# Patient Record
Sex: Male | Born: 1970 | Race: White | Hispanic: Yes | Marital: Married | Smoking: Former smoker
Health system: Southern US, Community
[De-identification: ages and names within clinical notes are randomized; demographics above are authoritative.]

## PROBLEM LIST (undated history)

## (undated) ENCOUNTER — Emergency Department (HOSPITAL_COMMUNITY): Admission: EM | Discharge status: Absent without leave | Payer: Self-pay

## (undated) DIAGNOSIS — E785 Hyperlipidemia, unspecified: Secondary | ICD-10-CM

## (undated) DIAGNOSIS — I219 Acute myocardial infarction, unspecified: Secondary | ICD-10-CM

## (undated) DIAGNOSIS — I10 Essential (primary) hypertension: Secondary | ICD-10-CM

## (undated) HISTORY — DX: Hyperlipidemia, unspecified: E78.5

## (undated) HISTORY — PX: CORONARY ANGIOPLASTY: SHX604

## (undated) HISTORY — DX: Essential (primary) hypertension: I10

---

## 2005-06-25 ENCOUNTER — Emergency Department (HOSPITAL_COMMUNITY): Admission: EM | Admit: 2005-06-25 | Discharge: 2005-06-25 | Payer: Self-pay | Admitting: Emergency Medicine

## 2005-10-22 ENCOUNTER — Emergency Department (HOSPITAL_COMMUNITY): Admission: EM | Admit: 2005-10-22 | Discharge: 2005-10-22 | Payer: Self-pay | Admitting: Emergency Medicine

## 2009-05-11 ENCOUNTER — Emergency Department (HOSPITAL_COMMUNITY): Admission: EM | Admit: 2009-05-11 | Discharge: 2009-05-12 | Payer: Self-pay | Admitting: Emergency Medicine

## 2015-03-22 HISTORY — PX: CARDIAC CATHETERIZATION: SHX172

## 2015-03-26 ENCOUNTER — Inpatient Hospital Stay (HOSPITAL_COMMUNITY)
Admission: EM | Admit: 2015-03-26 | Discharge: 2015-03-27 | DRG: 391 | Disposition: A | Discharge status: Home / Self Care | Payer: Commercial Managed Care - HMO | Attending: Cardiovascular Disease | Admitting: Cardiovascular Disease

## 2015-03-26 ENCOUNTER — Emergency Department (HOSPITAL_COMMUNITY): Payer: Commercial Managed Care - HMO

## 2015-03-26 ENCOUNTER — Encounter (HOSPITAL_COMMUNITY): Payer: Self-pay

## 2015-03-26 DIAGNOSIS — I249 Acute ischemic heart disease, unspecified: Secondary | ICD-10-CM | POA: Diagnosis present

## 2015-03-26 DIAGNOSIS — I214 Non-ST elevation (NSTEMI) myocardial infarction: Secondary | ICD-10-CM | POA: Diagnosis present

## 2015-03-26 DIAGNOSIS — I2510 Atherosclerotic heart disease of native coronary artery without angina pectoris: Secondary | ICD-10-CM | POA: Diagnosis present

## 2015-03-26 DIAGNOSIS — K2970 Gastritis, unspecified, without bleeding: Secondary | ICD-10-CM | POA: Diagnosis not present

## 2015-03-26 DIAGNOSIS — Z955 Presence of coronary angioplasty implant and graft: Secondary | ICD-10-CM

## 2015-03-26 DIAGNOSIS — R1013 Epigastric pain: Secondary | ICD-10-CM

## 2015-03-26 DIAGNOSIS — F419 Anxiety disorder, unspecified: Secondary | ICD-10-CM | POA: Diagnosis present

## 2015-03-26 DIAGNOSIS — Z79899 Other long term (current) drug therapy: Secondary | ICD-10-CM

## 2015-03-26 DIAGNOSIS — Z87891 Personal history of nicotine dependence: Secondary | ICD-10-CM

## 2015-03-26 DIAGNOSIS — R7989 Other specified abnormal findings of blood chemistry: Secondary | ICD-10-CM

## 2015-03-26 DIAGNOSIS — R778 Other specified abnormalities of plasma proteins: Secondary | ICD-10-CM

## 2015-03-26 DIAGNOSIS — Z7982 Long term (current) use of aspirin: Secondary | ICD-10-CM

## 2015-03-26 HISTORY — DX: Acute myocardial infarction, unspecified: I21.9

## 2015-03-26 LAB — BASIC METABOLIC PANEL
ANION GAP: 9 (ref 5–15)
BUN: 18 mg/dL (ref 6–20)
CALCIUM: 9.6 mg/dL (ref 8.9–10.3)
CO2: 24 mmol/L (ref 22–32)
CREATININE: 0.93 mg/dL (ref 0.61–1.24)
Chloride: 105 mmol/L (ref 101–111)
Glucose, Bld: 121 mg/dL — ABNORMAL HIGH (ref 65–99)
Potassium: 3.5 mmol/L (ref 3.5–5.1)
Sodium: 138 mmol/L (ref 135–145)

## 2015-03-26 LAB — CBC
HCT: 48.8 % (ref 39.0–52.0)
Hemoglobin: 17.1 g/dL — ABNORMAL HIGH (ref 13.0–17.0)
MCH: 32.8 pg (ref 26.0–34.0)
MCHC: 35 g/dL (ref 30.0–36.0)
MCV: 93.7 fL (ref 78.0–100.0)
PLATELETS: 257 10*3/uL (ref 150–400)
RBC: 5.21 MIL/uL (ref 4.22–5.81)
RDW: 12 % (ref 11.5–15.5)
WBC: 11.8 10*3/uL — ABNORMAL HIGH (ref 4.0–10.5)

## 2015-03-26 LAB — PROTIME-INR
INR: 1 (ref 0.00–1.49)
PROTHROMBIN TIME: 13.4 s (ref 11.6–15.2)

## 2015-03-26 LAB — I-STAT TROPONIN, ED: TROPONIN I, POC: 3.99 ng/mL — AB (ref 0.00–0.08)

## 2015-03-26 MED ORDER — NITROGLYCERIN 2 % TD OINT
1.0000 [in_us] | TOPICAL_OINTMENT | Freq: Four times a day (QID) | TRANSDERMAL | Status: DC
  Administered 2015-03-27: 1 [in_us] via TOPICAL
  Filled 2015-03-26: qty 1

## 2015-03-26 MED ORDER — MORPHINE SULFATE (PF) 4 MG/ML IV SOLN
4.0000 mg | Freq: Once | INTRAVENOUS | Status: AC
  Administered 2015-03-27: 4 mg via INTRAVENOUS
  Filled 2015-03-26: qty 1

## 2015-03-26 NOTE — ED Notes (Signed)
PA-C Bowie bedside with pt and family.

## 2015-03-26 NOTE — ED Provider Notes (Signed)
CSN: [id number]     Arrival date & time 03/26/15  2232 History   First MD Initiated Contact with Patient 03/26/15 2318     Chief Complaint  Patient presents with  . Chest Pain     (Consider location/radiation/quality/duration/timing/severity/associated sxs/prior Treatment) HPI   45 year old male with recent MI presents complaining of epigastric pain. Patient reported he was diagnosed with an MI 5 days ago when he was out of town at [facility] in [place] and he had a stent placed. He was discharged yesterday with cardiology follow-up and Effient. He has not had a chance to follow with cardiologist yet. Today he developed gradual onset of epigastric pain approximately 8 hours ago while he was laying down.  Pain is described as crampy, persistent, 5/10 nothing seems to make it better or worse.  He denies fever, chills, lightheadedness, dizziness, shortness of breath, diaphoresis, cough, hemoptysis, back pain, numbness or weakness. Patient states he feels very anxious when everything is in the hospital. He also mentioned that this pain felt similar to his heart attack pain except the location is in the abdomen instead of his chest. He did take his prescribed medication including Effient, aspirin, Atorvastatin, lisinopril, and metoprolol which were previously prescribed to him. He was a former smoker but quit last week. He reports remote history of cocaine use many years ago but none recently. He does drink alcohol, 3-4 beers daily. He does not know his family history well.  Past Medical History  Diagnosis Date  . MI (myocardial infarction) [facility](HCC)    Past Surgical History  Procedure Laterality Date  . Carotid stent     No family history on file. Social History  Substance Use Topics  . Smoking status: Former Games developermoker  . Smokeless tobacco: None  . Alcohol Use: Yes    Review of Systems  All other systems reviewed and are negative.     Allergies  Review of patient's allergies  indicates not on file.  Home Medications   Prior to Admission medications   Not on File   BP 111/68 mmHg  Pulse 82  Temp(Src) 98 F (36.7 C) (Oral)  Resp 16  Ht 5\' 9"  (1.753 m)  Wt 88.905 kg  BMI 28.93 kg/m2  SpO2 99% Physical Exam  Constitutional: He is oriented to person, place, and time. He appears well-developed and well-nourished. No distress.  HENT:  Head: Atraumatic.  Eyes: Conjunctivae are normal.  Neck: Neck supple. No JVD present.  Cardiovascular: Normal rate, regular rhythm and intact distal pulses.   Pulmonary/Chest: Effort normal and breath sounds normal.  Abdominal: Soft. Bowel sounds are normal. He exhibits no distension. There is no tenderness. There is no rebound and no guarding.  Musculoskeletal: He exhibits no edema.  Neurological: He is alert and oriented to person, place, and time.  Skin: No rash noted.  Psychiatric: He has a normal mood and affect.  Nursing note and vitals reviewed.   ED Course  Procedures (including critical care time) Labs Review Labs Reviewed  BASIC METABOLIC PANEL - Abnormal; Notable for the following:    Glucose, Bld 121 (*)    All other components within normal limits  CBC - Abnormal; Notable for the following:    WBC 11.8 (*)    Hemoglobin 17.1 (*)    All other components within normal limits  I-STAT TROPOININ, ED - Abnormal; Notable for the following:    Troponin i, poc 3.99 (*)    All other components within normal limits  PROTIME-INR  Imaging Review Dg Chest 2 View  03/26/2015  CLINICAL DATA:  Acute onset of generalized chest and epigastric abdominal pain. Initial encounter. EXAM: CHEST  2 VIEW COMPARISON:  None. FINDINGS: The lungs are well-aerated. Mild peribronchial thickening is noted. There is no evidence of focal opacification, pleural effusion or pneumothorax. The heart is normal in size; the mediastinal contour is within normal limits. No acute osseous abnormalities are seen. IMPRESSION: Mild peribronchial  thickening noted.  Lungs otherwise clear. Electronically Signed   By: Roanna Raider M.D.   On: 03/26/2015 23:03   I have personally reviewed and evaluated these images and lab results as part of my medical decision-making.   EKG Interpretation   Date/Time:  Saturday March 26 2015 22:32:11 EST Ventricular Rate:  83 PR Interval:  166 QRS Duration: 82 QT Interval:  362 QTC Calculation: 425 R Axis:   -14 Text Interpretation:  Normal sinus rhythm Inferior infarct , age  undetermined ST \\T \ T wave abnormality, consider lateral ischemia Abnormal  ECG Sinus rhythm T wave abnormality RSR' pattern in V1 Abnormal ekg  Confirmed by Gerhard Munch  MD [phone]) on 03/26/2015 10:41:19 PM      MDM   Final diagnoses:  Epigastric abdominal pain  Elevated troponin I level    BP 136/94 mmHg  Pulse 97  Temp(Src) 98 F (36.7 C) (Oral)  Resp 16  Ht 5\' 9"  (1.753 m)  Wt 88.905 kg  BMI 28.93 kg/m2  SpO2 100%   12:04 AM Patient with recent MI here with epigastric pain which he felt similar to his prior MI. Does have EKG changes concerning for inferior/lateral MI and elevated troponin of 3.99.  No reproducible abdominal tenderness on exam.  We have contact [facility] requesting for his hospitalization paperwork to be faxed to [place].  In the mean time,  Patient will be started on heparin, nitroglycerin, and morphine and supplemental O2. I have consult to cardiologist, Dr. Algie Coffer who agrees to see patient in the ER and was admitted for further management. Care discussed with Dr. Judd Lien.  12:08 AM Pt made aware of plan and agrees with plan.  Will monitor closely.    CRITICAL CARE Performed by: Fayrene Helper Total critical care time: 35 minutes Critical care time was exclusive of separately billable procedures and treating other patients. Critical care was necessary to treat or prevent imminent or life-threatening deterioration. Critical care was time spent personally by me on the following  activities: development of treatment plan with patient and/or surrogate as well as nursing, discussions with consultants, evaluation of patient's response to treatment, examination of patient, obtaining history from patient or surrogate, ordering and performing treatments and interventions, ordering and review of laboratory studies, ordering and review of radiographic studies, pulse oximetry and re-evaluation of patient's condition.   Fayrene Helper, PA-C 03/27/15 [id number]  Geoffery Lyons, MD 03/27/15 [phone]

## 2015-03-26 NOTE — ED Notes (Signed)
Pt here for epigastric pain and states last time he had pain to this area he had a heart attack. Pt states he had an MI Tuesday while he was out of town and had a stent placed. Pain onset tonight at 4pm while he was laying down. He has not followed up with cardiologist yet. Denies SOB/N/V but does report diarrhea.

## 2015-03-27 DIAGNOSIS — F419 Anxiety disorder, unspecified: Secondary | ICD-10-CM | POA: Diagnosis present

## 2015-03-27 DIAGNOSIS — Z87891 Personal history of nicotine dependence: Secondary | ICD-10-CM | POA: Diagnosis not present

## 2015-03-27 DIAGNOSIS — I214 Non-ST elevation (NSTEMI) myocardial infarction: Secondary | ICD-10-CM | POA: Diagnosis present

## 2015-03-27 DIAGNOSIS — R1013 Epigastric pain: Secondary | ICD-10-CM | POA: Diagnosis present

## 2015-03-27 DIAGNOSIS — I2510 Atherosclerotic heart disease of native coronary artery without angina pectoris: Secondary | ICD-10-CM | POA: Diagnosis present

## 2015-03-27 DIAGNOSIS — Z955 Presence of coronary angioplasty implant and graft: Secondary | ICD-10-CM | POA: Diagnosis not present

## 2015-03-27 DIAGNOSIS — K2970 Gastritis, unspecified, without bleeding: Secondary | ICD-10-CM | POA: Diagnosis present

## 2015-03-27 DIAGNOSIS — Z7982 Long term (current) use of aspirin: Secondary | ICD-10-CM | POA: Diagnosis not present

## 2015-03-27 DIAGNOSIS — Z79899 Other long term (current) drug therapy: Secondary | ICD-10-CM | POA: Diagnosis not present

## 2015-03-27 DIAGNOSIS — I249 Acute ischemic heart disease, unspecified: Secondary | ICD-10-CM | POA: Diagnosis present

## 2015-03-27 LAB — BASIC METABOLIC PANEL
Anion gap: 11 (ref 5–15)
BUN: 17 mg/dL (ref 6–20)
CHLORIDE: 105 mmol/L (ref 101–111)
CO2: 23 mmol/L (ref 22–32)
CREATININE: 0.93 mg/dL (ref 0.61–1.24)
Calcium: 9.3 mg/dL (ref 8.9–10.3)
GFR calc Af Amer: >60 mL/min (ref >60)
GFR calc non Af Amer: >60 mL/min (ref >60)
Glucose, Bld: 128 mg/dL — ABNORMAL HIGH (ref 65–99)
Potassium: 3.9 mmol/L (ref 3.5–5.1)
Sodium: 139 mmol/L (ref 135–145)

## 2015-03-27 LAB — CBC
HCT: 44.9 % (ref 39.0–52.0)
HEMOGLOBIN: 15.6 g/dL (ref 13.0–17.0)
MCH: 32.2 pg (ref 26.0–34.0)
MCHC: 34.7 g/dL (ref 30.0–36.0)
MCV: 92.6 fL (ref 78.0–100.0)
PLATELETS: 251 10*3/uL (ref 150–400)
RBC: 4.85 MIL/uL (ref 4.22–5.81)
RDW: 11.7 % (ref 11.5–15.5)
WBC: 12.2 10*3/uL — ABNORMAL HIGH (ref 4.0–10.5)

## 2015-03-27 LAB — LIPID PANEL
CHOL/HDL RATIO: 5.5 ratio
Cholesterol: 175 mg/dL (ref 0–200)
HDL: 32 mg/dL — ABNORMAL LOW (ref >40)
LDL CALC: 119 mg/dL — AB (ref 0–99)
TRIGLYCERIDES: 120 mg/dL (ref <150)
VLDL: 24 mg/dL (ref 0–40)

## 2015-03-27 LAB — HEPARIN LEVEL (UNFRACTIONATED): HEPARIN UNFRACTIONATED: 0.48 [IU]/mL (ref 0.30–0.70)

## 2015-03-27 LAB — PROTIME-INR
INR: 1.08 (ref 0.00–1.49)
PROTHROMBIN TIME: 14.2 s (ref 11.6–15.2)

## 2015-03-27 LAB — BRAIN NATRIURETIC PEPTIDE: B Natriuretic Peptide: 60.9 pg/mL (ref 0.0–100.0)

## 2015-03-27 LAB — TROPONIN I: TROPONIN I: 1.58 ng/mL — AB (ref <0.031)

## 2015-03-27 MED ORDER — ONDANSETRON HCL 4 MG/2ML IJ SOLN
4.0000 mg | Freq: Four times a day (QID) | INTRAMUSCULAR | Status: DC | PRN
  Administered 2015-03-27: 4 mg via INTRAVENOUS
  Filled 2015-03-27: qty 2

## 2015-03-27 MED ORDER — ASPIRIN EC 81 MG PO TBEC: 81.0000 mg | DELAYED_RELEASE_TABLET | Freq: Every day | ORAL | Status: DC

## 2015-03-27 MED ORDER — HEPARIN BOLUS VIA INFUSION
4000.0000 [IU] | Freq: Once | INTRAVENOUS | Status: AC
  Administered 2015-03-27: 4000 [IU] via INTRAVENOUS
  Filled 2015-03-27: qty 4000

## 2015-03-27 MED ORDER — ACETAMINOPHEN 325 MG PO TABS
650.0000 mg | ORAL_TABLET | ORAL | Status: DC | PRN
  Administered 2015-03-27: 650 mg via ORAL
  Filled 2015-03-27: qty 2

## 2015-03-27 MED ORDER — PRASUGREL HCL 10 MG PO TABS
10.0000 mg | ORAL_TABLET | Freq: Every day | ORAL | Status: DC
  Administered 2015-03-27: 10 mg via ORAL
  Filled 2015-03-27 (×2): qty 1

## 2015-03-27 MED ORDER — ATORVASTATIN CALCIUM 10 MG PO TABS: 80.0000 mg | ORAL_TABLET | Freq: Every day | ORAL | Status: DC

## 2015-03-27 MED ORDER — PANTOPRAZOLE SODIUM 40 MG PO TBEC: 40.0000 mg | DELAYED_RELEASE_TABLET | Freq: Every day | ORAL | Status: AC

## 2015-03-27 MED ORDER — PANTOPRAZOLE SODIUM 40 MG PO TBEC: 40.0000 mg | DELAYED_RELEASE_TABLET | Freq: Every day | ORAL | Status: DC

## 2015-03-27 MED ORDER — SODIUM CHLORIDE 0.9 % IJ SOLN: 3.0000 mL | INTRAMUSCULAR | Status: DC | PRN

## 2015-03-27 MED ORDER — NITROGLYCERIN 0.4 MG SL SUBL: 0.4000 mg | SUBLINGUAL_TABLET | SUBLINGUAL | Status: DC | PRN

## 2015-03-27 MED ORDER — METOPROLOL TARTRATE 25 MG PO TABS
25.0000 mg | ORAL_TABLET | Freq: Two times a day (BID) | ORAL | Status: DC
  Administered 2015-03-27 (×2): 25 mg via ORAL
  Filled 2015-03-27 (×2): qty 1

## 2015-03-27 MED ORDER — LISINOPRIL 2.5 MG PO TABS
2.5000 mg | ORAL_TABLET | Freq: Every day | ORAL | Status: DC
  Administered 2015-03-27: 2.5 mg via ORAL
  Filled 2015-03-27 (×2): qty 1

## 2015-03-27 MED ORDER — SODIUM CHLORIDE 0.9 % IJ SOLN: 3.0000 mL | Freq: Two times a day (BID) | INTRAMUSCULAR | Status: DC

## 2015-03-27 MED ORDER — SODIUM CHLORIDE 0.9 % IV SOLN: 250.0000 mL | INTRAVENOUS | Status: DC | PRN

## 2015-03-27 MED ORDER — STERILE WATER FOR INJECTION IJ SOLN
INTRAMUSCULAR | Status: AC
  Filled 2015-03-27: qty 10

## 2015-03-27 MED ORDER — PANTOPRAZOLE SODIUM 40 MG IV SOLR
40.0000 mg | INTRAVENOUS | Status: DC
  Administered 2015-03-27: 40 mg via INTRAVENOUS
  Filled 2015-03-27: qty 40

## 2015-03-27 MED ORDER — HEPARIN (PORCINE) IN NACL 100-0.45 UNIT/ML-% IJ SOLN
1250.0000 [IU]/h | INTRAMUSCULAR | Status: DC
  Administered 2015-03-27: 1000 [IU]/h via INTRAVENOUS
  Filled 2015-03-27 (×3): qty 250

## 2015-03-27 NOTE — ED Notes (Signed)
Paged MD about patient being Discharged. Patient able to ambulate without assistance.

## 2015-03-27 NOTE — Progress Notes (Signed)
ANTICOAGULATION CONSULT NOTE  Pharmacy Consult for Heparin Indication: chest pain/ACS  Not on File  Patient Measurements: Height: 5\' 9"  (175.3 cm) Weight: 196 lb (88.905 kg) IBW/kg (Calculated) : 70.7 Heparin Dosing Weight: 89 kg  Vital Signs: BP: 113/84 mmHg (01/15 1117) Pulse Rate: 77 (01/15 1117)  Labs:  Recent Labs  03/26/15 2259 03/27/15 0325 03/27/15 0825 03/27/15 0957  HGB 17.1* 15.6  --   --   HCT 48.8 44.9  --   --   PLT 257 251  --   --   LABPROT 13.4 14.2  --   --   INR 1.00 1.08  --   --   HEPARINUNFRC  --   --  0.48  --   CREATININE 0.93 0.93  --   --   TROPONINI  --   --   --  1.58*    Estimated Creatinine Clearance: 110.7 mL/min (by C-G formula based on Cr of 0.93).  Assessment: 45 y.o. M presents with CP. Recent MI (5 days ago) [place] Trop 3.99. CBC ok on admission. Heparin per pharmacy for r/o ACS. HL therapeutic at 0.48 after 4000 unit bolus and drip at 1250 units/hr.  CBC stable. No bleeding reported. Trop 1.58.  Cards plans to treat medically if troponins are trending down with TCTS consult in near future for CABG.   Goal of Therapy:  Heparin level 0.3-0.7 units/ml Monitor platelets by anticoagulation protocol: Yes   Plan:  Continue Heparin gtt at 1250 units/hr 2nd HL at 2115 with next cardiac enzyme Daily heparin level and CBC while on heparin  Herby AbrahamMichelle T. Cono Gebhard, Pharm.D. [phone] 03/27/2015 11:33 AM

## 2015-03-27 NOTE — ED Notes (Signed)
Dr Algie Cofferkadakia back in to see

## 2015-03-27 NOTE — ED Notes (Signed)
Dr kadakia at the bedside 

## 2015-03-27 NOTE — ED Notes (Signed)
Admitting at bedside states patient will be discharged.

## 2015-03-27 NOTE — ED Notes (Addendum)
Pt here with mid-abd pain sinced this am  No n v.  He was admitted  At [facility] in [place]  One week ago  He had a mi and had a stent placed.  His pain at that time was in his epigastric area not mid-abd as now.  nsr on the monitor.  Alert oriented skin warm and dry

## 2015-03-27 NOTE — ED Notes (Signed)
Patient getting dressed,

## 2015-03-27 NOTE — ED Notes (Signed)
Pt. Reports having a headache, denies any chest pain

## 2015-03-27 NOTE — Progress Notes (Signed)
ANTICOAGULATION CONSULT NOTE - Initial Consult  Pharmacy Consult for Heparin Indication: chest pain/ACS  Not on File  Patient Measurements: Height: 5\' 9"  (175.3 cm) Weight: 196 lb (88.905 kg) IBW/kg (Calculated) : 70.7 Heparin Dosing Weight: 89 kg  Vital Signs: Temp: 98 F (36.7 C) (01/14 2241) Temp Source: Oral (01/14 2241) BP: 136/94 mmHg (01/14 2335) Pulse Rate: 97 (01/14 2335)  Labs:  Recent Labs  03/26/15 2259  HGB 17.1*  HCT 48.8  PLT 257  LABPROT 13.4  INR 1.00  CREATININE 0.93    Estimated Creatinine Clearance: 110.7 mL/min (by C-G formula based on Cr of 0.93).   Medical History: Past Medical History  Diagnosis Date  . MI (myocardial infarction) (HCC)     Medications:  See electronic med rec  Assessment: 45 y.o. M presents with CP. Recent MI (5 days ago) [place] Trop 3.99. CBC ok on admission. To begin heparin for r/o ACS. Heparin 4000 units now and 1000 units/hr ordered by ED PA but not started yet.   Goal of Therapy:  Heparin level 0.3-0.7 units/ml Monitor platelets by anticoagulation protocol: Yes   Plan:  Heparin 4000 units IV bolus Heparin gtt at 1250 units/hr Will f/u heparin level in 6 hours Daily heparin level and CBC  Christoper Fabianaron Hendrick Pavich, PharmD, BCPS Clinical pharmacist, pager [phone] 03/27/2015,12:54 AM

## 2015-03-27 NOTE — H&P (Signed)
Referring Physician:  Ronaldo Crilly is an 45 y.o. male.                       Chief Complaint: Epigastric pain  HPI: 45 year old male with recent MI presents complaining of epigastric pain. Patient reported he was diagnosed with an MI 5 days ago when he was out of town at [employer] in [place] and he had a stent placed. He was discharged yesterday with cardiology follow-up and Effient. He has not had a chance to follow with cardiologist yet. Today he developed gradual onset of epigastric pain approximately 8 hours ago while he was laying down. Pain is described as crampy, persistent, 5/10 nothing seems to make it better or worse. He denies fever, chills, lightheadedness, dizziness, shortness of breath, diaphoresis, cough, hemoptysis, back pain, numbness or weakness. Patient states he feels very anxious when everything is in the hospital. He also mentioned that this pain felt similar to his heart attack pain except the location is in the abdomen instead of his chest. He did take his prescribed medication including Effient, aspirin, Atorvastatin, lisinopril, and metoprolol which were previously prescribed to him. He was a former smoker but quit last week. He reports remote history of cocaine use many years ago but none recently. He does drink alcohol, 3-4 beers daily. His cath and angioplasty records from [facility] center/[place] reveal bare metal mid LCX stenting and 3 vessel disease with CABG surgery recommendation post 6 weeks to 1 year.  Past Medical History  Diagnosis Date  . MI (myocardial infarction) [facility])       Past Surgical History  Procedure Laterality Date  . Carotid stent      No family history on file. Social History:  reports that he has quit smoking. He does not have any smokeless tobacco history on file. He reports that he drinks alcohol. His drug history is not on file.  Allergies: Not on File   (Not in a hospital admission)  Results for  orders placed or performed during the hospital encounter of 03/26/15 (from the past 48 hour(s))  Basic metabolic panel     Status: Abnormal   Collection Time: 03/26/15 10:59 PM  Result Value Ref Range   Sodium 138 135 - 145 mmol/L   Potassium 3.5 3.5 - 5.1 mmol/L   Chloride 105 101 - 111 mmol/L   CO2 24 22 - 32 mmol/L   Glucose, Bld 121 (H) 65 - 99 mg/dL   BUN 18 6 - 20 mg/dL   Creatinine, Ser 0.93 0.61 - 1.24 mg/dL   Calcium 9.6 8.9 - 10.3 mg/dL   GFR calc non Af Amer >60 >60 mL/min   GFR calc Af Amer >60 >60 mL/min    Comment: (NOTE) The eGFR has been calculated using the CKD EPI equation. This calculation has not been validated in all clinical situations. eGFR's persistently <60 mL/min signify possible Chronic Kidney Disease.    Anion gap 9 5 - 15  CBC     Status: Abnormal   Collection Time: 03/26/15 10:59 PM  Result Value Ref Range   WBC 11.8 (H) 4.0 - 10.5 K/uL   RBC 5.21 4.22 - 5.81 MIL/uL   Hemoglobin 17.1 (H) 13.0 - 17.0 g/dL   HCT 48.8 39.0 - 52.0 %   MCV 93.7 78.0 - 100.0 fL   MCH 32.8 26.0 - 34.0 pg   MCHC 35.0 30.0 - 36.0 g/dL   RDW 12.0 11.5 - 15.5 %  Platelets 257 150 - 400 K/uL  Protime-INR - (order if Patient is taking Coumadin / Warfarin)     Status: None   Collection Time: 03/26/15 10:59 PM  Result Value Ref Range   Prothrombin Time 13.4 11.6 - 15.2 seconds   INR 1.00 0.00 - 1.49  I-stat troponin, ED (not at [facility] [place] [facility])     Status: Abnormal   Collection Time: 03/26/15 11:00 PM  Result Value Ref Range   Troponin i, poc 3.99 (HH) 0.00 - 0.08 ng/mL   Comment NOTIFIED PHYSICIAN    Comment 3            Comment: Due to the release kinetics of cTnI, a negative result within the first hours of the onset of symptoms does not rule out myocardial infarction with certainty. If myocardial infarction is still suspected, repeat the test at appropriate intervals.   Brain natriuretic peptide     Status: None   Collection Time: 03/27/15 12:49 AM  Result Value Ref  Range   B Natriuretic Peptide 60.9 0.0 - 100.0 pg/mL   Dg Chest 2 View  03/26/2015  CLINICAL DATA:  Acute onset of generalized chest and epigastric abdominal pain. Initial encounter. EXAM: CHEST  2 VIEW COMPARISON:  None. FINDINGS: The lungs are well-aerated. Mild peribronchial thickening is noted. There is no evidence of focal opacification, pleural effusion or pneumothorax. The heart is normal in size; the mediastinal contour is within normal limits. No acute osseous abnormalities are seen. IMPRESSION: Mild peribronchial thickening noted.  Lungs otherwise clear. Electronically Signed   By: Garald Balding M.D.   On: 03/26/2015 23:03    Review Of Systems   Blood pressure 119/84, pulse 87, temperature 98 F (36.7 C), temperature source Oral, resp. rate 26, height '5\' 9"'  (1.753 m), weight 88.905 kg (196 lb), SpO2 99 %.  Physical Exam  Constitutional:  He appears well-developed and well-nourished. No distress.  HENT: Normocephalic and Atraumatic.  Eyes: Conjunctivae are normal. Sclera-non-icteric Neck: Neck supple. No JVD present.  Cardiovascular: Normal rate, regular rhythm and intact distal pulses. II/VI systolic murmur Pulmonary/Chest: Effort normal and breath sounds normal.  Abdominal: Soft. Bowel sounds are normal. He exhibits no distension. There is no tenderness. Musculoskeletal: No edema, cyanosis or clubbing.  Neurological: He is alert and oriented to person, place, and time. Moves all 4 extremities. Skin: No rash noted.  Psychiatric: He has a normal mood and affect.  Nursing note and vitals reviewed.  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  Treat medically if trending down. CVTS consult in near future.  Birdie Riddle, MD  03/27/2015, 2:11 AM

## 2015-03-27 NOTE — Discharge Summary (Signed)
Physician Discharge Summary  Patient ID: Danny Mendoza MRN: [id number] DOB/AGE: 1970/10/14 45 y.o.  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 His condition improves with IV pantoprazole. His Troponin-I showed downward trend. He has significant 3 vessel disease, his culprit lesion in left circumflex artery was treated with bare metal stent at [facility] 5 days ago and was advised to see local CVTS for CABG in near future. He will be followed by me in 5 days.  Consults: cardiology  Significant Diagnostic Studies: labs: Near normal CBC and CMET. Troponin-I of 3.99 followed by 1.58 ng/ml. Normal total cholesterol level with elevated LDL cholesterol of 118 mg/dL  EKG-NSR, inferior MI and lateral ischemia  Treatments: cardiac meds: lisinopril (Prinivil), metoprolol and aspirin and prasugrel. Stomach medicine: Pantoprazole.  Discharge Exam: Blood pressure 116/81, pulse 73, temperature 98 F (36.7 C), temperature source Oral, resp. rate 19, height 5\' 9"  (1.753 m), weight 88.905 kg (196 lb), SpO2 99 %. Constitutional: He appears well-developed and well-nourished. No distress.  HENT: Normocephalic and Atraumatic.  Eyes: Conjunctivae are normal. Sclera-non-icteric Neck: Neck supple. No JVD present.  Cardiovascular: Normal rate, regular rhythm and intact distal pulses. II/VI systolic murmur Pulmonary/Chest: Effort normal and breath sounds normal.  Abdominal: Soft. Bowel  sounds are normal. He exhibits no distension. There is no tenderness. Musculoskeletal: No edema, cyanosis or clubbing.  Neurological: He is alert and oriented to person, place, and time. Moves all 4 extremities. Skin: No rash noted.  Psychiatric: He has a normal mood and affect.   Disposition: 01-Self care or home.     Medication List    TAKE these medications        aspirin 81 MG chewable tablet  Chew 81 mg by mouth daily.     atorvastatin 80 MG tablet  Commonly known as:  LIPITOR  Take 80 mg by mouth daily at 6 PM.     lisinopril 2.5 MG tablet  Commonly known as:  PRINIVIL,ZESTRIL  Take 2.5 mg by mouth daily.     metoprolol tartrate 25 MG tablet  Commonly known as:  LOPRESSOR  Take 25 mg by mouth 2 (two) times daily.     nitroGLYCERIN 0.4 MG SL tablet  Commonly known as:  NITROSTAT  Place 0.4 mg under the tongue every 5 (five) minutes as needed for chest pain.     pantoprazole 40 MG tablet  Commonly known as:  PROTONIX  Take 1 tablet (40 mg total) by mouth at bedtime.     prasugrel 10 MG Tabs tablet  Commonly known as:  EFFIENT  Take 10 mg by mouth daily.           Follow-up Information    [facility]hal[facility] Morissette S, MD. Schedule an appointment as soon as possible for a visit in 5 days.   Specialty:  Cardiology   Contact information:   [address] [place] [address] [phone]       Signed: Ricki RodriguezKADAKIA,Samie Barclift S 03/27/2015, 2:15 PM

## 2015-03-27 NOTE — ED Notes (Signed)
The pts abd pain is better.  They med has helped

## 2015-03-27 NOTE — ED Notes (Signed)
C/o abd pain med given

## 2015-03-27 NOTE — ED Notes (Signed)
Asking for pain meds

## 2015-03-27 NOTE — ED Notes (Signed)
The pt reports that he is not having chest pain he has had abd pain all day.

## 2015-03-27 NOTE — ED Notes (Signed)
Spoke with Admitting. Ordered HH per MD and gave medications.

## 2015-03-27 NOTE — ED Notes (Signed)
Pt ambulated in the hallway with ease. No complaints

## 2015-04-19 ENCOUNTER — Other Ambulatory Visit: Payer: Self-pay | Admitting: *Deleted

## 2015-04-19 ENCOUNTER — Encounter: Payer: Self-pay | Admitting: Thoracic Surgery (Cardiothoracic Vascular Surgery)

## 2015-04-19 ENCOUNTER — Institutional Professional Consult (permissible substitution) (INDEPENDENT_AMBULATORY_CARE_PROVIDER_SITE_OTHER): Payer: Commercial Managed Care - HMO | Admitting: Thoracic Surgery (Cardiothoracic Vascular Surgery)

## 2015-04-19 VITALS — BP 139/95 | HR 74 | Resp 16 | Ht 69.0 in | Wt 190.0 lb

## 2015-04-19 DIAGNOSIS — I213 ST elevation (STEMI) myocardial infarction of unspecified site: Secondary | ICD-10-CM

## 2015-04-19 DIAGNOSIS — I2510 Atherosclerotic heart disease of native coronary artery without angina pectoris: Secondary | ICD-10-CM

## 2015-04-19 NOTE — Progress Notes (Signed)
PCP is No PCP Per Patient Referring Provider is Lorre Nick, MD Cardiology- Orpah Cobb, MD Leonette Most, MD and Keith Rake MD, [place], [place]  Chief Complaint  Patient presents with  . Coronary Artery Disease    3V/STEMI .Marland KitchenMarland KitchenCATHED 03/22/15 in [place], [place]., STENT to [facility], ECHO 03/22/15    HPI: [name] is a 45 year old man with a past medical history of hypertension who presented to the hospital in [place] in January with chest pain. He says this began the day before presentation with the sensation that he needed to belch, but could not. He continued to have that discomfort overnight and then the following morning it was worse. He eventually went to the emergency room and was diagnosed with an ST elevation MI. He was taken urgently to the catheterization laboratory by Dr. Leonette Most where he was found to have three-vessel disease. His circumflex was totally occluded and a bare-metal stent was placed with an excellent angiographic result. He was started on aspirin and prasugrel. He was seen in consultation by Dr. Keith Rake who recommended coronary bypass grafting at around a 6 week interval post MI. The patient lives in [place] and wish to have the remainder of his treatment done here.  After returning to [facility] he had an episode of epigastric pain on January 14. He was seen in the emergency room at [facility]. He ruled out for MI. He was observed overnight and then released the following day. He has not had any additional epigastric or chest pain since that time.  He is not close to his family but is not aware of any premature coronary disease. He has been feeling fatigued for several months prior to surgery. He has not had any orthopnea, paroxysmal nocturnal dyspnea, or peripheral edema. He does complain of some burning with urination, but denies hematuria or pyuria.   Past Medical History  Diagnosis Date  . MI (myocardial infarction) (HCC)   . Hypertension   . Dyslipidemia      Past Surgical History  Procedure Laterality Date  . Carotid stent    . Cardiac catheterization  03/22/2015    [facility]    No family history on file.  Social History Social History  Substance Use Topics  . Smoking status: Former Smoker -- 0.25 packs/day for 20 years    Types: Cigarettes    Quit date: 03/22/2015  . Smokeless tobacco: Never Used     Comment: QUIT AT TIME OF HIS MI  . Alcohol Use: No     Comment: QUIT AT TIME OF HIS MI...USUALLY ONE TO FOUR BEERS A DAY    Current Outpatient Prescriptions  Medication Sig Dispense Refill  . aspirin 81 MG chewable tablet Chew 81 mg by mouth daily.    Marland Kitchen atorvastatin (LIPITOR) 80 MG tablet Take 80 mg by mouth daily at 6 PM.    . lisinopril (PRINIVIL,ZESTRIL) 2.5 MG tablet Take 2.5 mg by mouth daily.    . metoprolol tartrate (LOPRESSOR) 25 MG tablet Take 25 mg by mouth 2 (two) times daily.    . nitroGLYCERIN (NITROSTAT) 0.4 MG SL tablet Place 0.4 mg under the tongue every 5 (five) minutes as needed for chest pain.    . pantoprazole (PROTONIX) 40 MG tablet Take 1 tablet (40 mg total) by mouth at bedtime. 30 tablet 3  . prasugrel (EFFIENT) 10 MG TABS tablet Take 10 mg by mouth daily.     No current facility-administered medications for this visit.    No Known Allergies  Review of Systems  Constitutional: Positive for activity change and fatigue. Negative for fever, chills and appetite change.  HENT: Positive for dental problem. Negative for trouble swallowing.   Eyes: Negative for visual disturbance.  Respiratory: Positive for shortness of breath. Negative for cough and wheezing.   Cardiovascular: Positive for chest pain. Negative for palpitations and leg swelling.  Gastrointestinal: Positive for abdominal pain. Negative for blood in stool and abdominal distention.  Endocrine: Negative for cold intolerance and heat intolerance.  Genitourinary: Positive for dysuria. Negative for hematuria.  Musculoskeletal:  Negative for myalgias and arthralgias.  Neurological: Negative for dizziness and weakness.  Hematological: Negative for adenopathy. Does not bruise/bleed easily.  Psychiatric/Behavioral:       Feels stressed and anxious    BP 139/95 mmHg  Pulse 74  Resp 16  Ht  (1.753 m)  Wt 190 lb (86.183 kg)  BMI 28.05 kg/m2  SpO2 98% Physical Exam  Constitutional: He is oriented to person, place, and time. He appears well-developed and well-nourished. No distress.  HENT:  Head: Normocephalic and atraumatic.  Mouth/Throat: No oropharyngeal exudate.  Eyes: Conjunctivae and EOM are normal. Pupils are equal, round, and reactive to light. No scleral icterus.  Neck: Normal range of motion. Neck supple. No thyromegaly present.  Cardiovascular: Normal rate, regular rhythm, normal heart sounds and intact distal pulses.  Exam reveals no gallop and no friction rub.   No murmur heard. Allen's test left- ~ 3 sec to refill  Pulmonary/Chest: Effort normal and breath sounds normal. No respiratory distress. He has no wheezes. He has no rales.  Abdominal: Soft. Bowel sounds are normal. He exhibits no distension. There is no tenderness.  Musculoskeletal: He exhibits no edema or tenderness.  Lymphadenopathy:    He has no cervical adenopathy.  Neurological: He is alert and oriented to person, place, and time. No cranial nerve deficit. He exhibits normal muscle tone. Coordination normal.  Motor 5/5 bilaterally  Skin: Skin is warm and dry.  Psychiatric: He has a normal mood and affect.  Vitals reviewed.    Diagnostic Tests: I personally reviewed the cardiac catheterization films and echocardiogram done in [place].   He has three-vessel disease. He had a totally occluded circumflex which was stented with a good result. He appears to have some narrowing in the proximal circumflex. There is a complex lesion in the LAD at the takeoff of the second diagonal and a large septal perforating branch is  approximately 85% stenosis. He has about 70% distal right coronary stenosis with an 85% stenosis in a PL branch as well.  Echocardiogram showed EF of approximately 50%. There was no significant valvular pathology.  Impression: 45 year old man who presented in early January in [place] with an acute coronary syndrome. He had a totally occluded left circumflex which was stented acutely with a bare metal stent. His catheterization showed three-vessel coronary disease and he has significant stenoses in both the right and LAD distributions that are not amenable to percutaneous intervention. He would benefit from coronary bypass grafting for survival benefit.  I discussed the general nature of the procedure, the need for general anesthesia, the use of cardio pulmonary bypass, and the incisions to be used with Mr. and Mrs Yeatts. I reviewed the expected hospital stay, overall recovery and short and long term outcomes. He understands this is a palliative operation not a curative one. I reviewed the indications, risks, benefits, and alternatives. They understand the risks include, but are not limited to death, stroke, MI, DVT/PE, bleeding,  possible need for transfusion, infections, cardiac arrhythmias, phrenic nerve dysfunction, and other organ system dysfunction including respiratory, renal, or GI complications.   He accepts the risk and agrees to proceed.  He is young, but his anatomy is not completely favorable for multiple arterial grafting. His second best target is the second diagonal branch of the LAD so it would be suitable to use a right coronary to that. His Allen's test was acceptable but a little slow on refill. Given that the anatomy is not typically favorable I think we would plan to do a vein graft to the right coronary system. He does have some narrowing in the proximal circumflex. I'll plan to Place a vein graft to that vessel time of CABG.   Plan: Coronary bypass grafting on  05/04/2015.  Stop prasugrel after dose on 04/27/2015.  I spent 60 minutes with Mr. and Mrs. Portis during this visit, greater than 50% spent in counseling  Loreli Slot, MD [facility] [phone]

## 2015-04-19 NOTE — Patient Instructions (Signed)
Stop Effient (prasugrel) after the dose on 04/27/15

## 2015-05-02 ENCOUNTER — Encounter (HOSPITAL_COMMUNITY)
Admission: RE | Admit: 2015-05-02 | Discharge: 2015-05-02 | Disposition: A | Discharge status: Home / Self Care | Payer: Commercial Managed Care - HMO | Source: Ambulatory Visit | Attending: Thoracic Surgery (Cardiothoracic Vascular Surgery) | Admitting: Thoracic Surgery (Cardiothoracic Vascular Surgery)

## 2015-05-02 ENCOUNTER — Ambulatory Visit (HOSPITAL_BASED_OUTPATIENT_CLINIC_OR_DEPARTMENT_OTHER)
Admission: RE | Admit: 2015-05-02 | Discharge: 2015-05-02 | Disposition: A | Discharge status: Home / Self Care | Payer: Commercial Managed Care - HMO | Source: Ambulatory Visit | Attending: Thoracic Surgery (Cardiothoracic Vascular Surgery) | Admitting: Thoracic Surgery (Cardiothoracic Vascular Surgery)

## 2015-05-02 ENCOUNTER — Encounter (HOSPITAL_COMMUNITY): Payer: Self-pay

## 2015-05-02 VITALS — BP 146/94 | HR 72 | Temp 98.1°F | Resp 20 | Ht 69.0 in | Wt 201.5 lb

## 2015-05-02 DIAGNOSIS — Z955 Presence of coronary angioplasty implant and graft: Secondary | ICD-10-CM | POA: Insufficient documentation

## 2015-05-02 DIAGNOSIS — Z87891 Personal history of nicotine dependence: Secondary | ICD-10-CM

## 2015-05-02 DIAGNOSIS — Z01818 Encounter for other preprocedural examination: Secondary | ICD-10-CM

## 2015-05-02 DIAGNOSIS — Z79899 Other long term (current) drug therapy: Secondary | ICD-10-CM | POA: Insufficient documentation

## 2015-05-02 DIAGNOSIS — E785 Hyperlipidemia, unspecified: Secondary | ICD-10-CM | POA: Insufficient documentation

## 2015-05-02 DIAGNOSIS — I2510 Atherosclerotic heart disease of native coronary artery without angina pectoris: Secondary | ICD-10-CM

## 2015-05-02 DIAGNOSIS — Z01812 Encounter for preprocedural laboratory examination: Secondary | ICD-10-CM

## 2015-05-02 DIAGNOSIS — I10 Essential (primary) hypertension: Secondary | ICD-10-CM | POA: Insufficient documentation

## 2015-05-02 DIAGNOSIS — I6522 Occlusion and stenosis of left carotid artery: Secondary | ICD-10-CM | POA: Insufficient documentation

## 2015-05-02 DIAGNOSIS — I252 Old myocardial infarction: Secondary | ICD-10-CM

## 2015-05-02 DIAGNOSIS — Z7982 Long term (current) use of aspirin: Secondary | ICD-10-CM

## 2015-05-02 DIAGNOSIS — Z0183 Encounter for blood typing: Secondary | ICD-10-CM | POA: Insufficient documentation

## 2015-05-02 LAB — URINALYSIS, ROUTINE W REFLEX MICROSCOPIC
Bilirubin Urine: NEGATIVE
Glucose, UA: NEGATIVE mg/dL
Hgb urine dipstick: NEGATIVE
KETONES UR: NEGATIVE mg/dL
LEUKOCYTES UA: NEGATIVE
NITRITE: NEGATIVE
PH: 5.5 (ref 5.0–8.0)
Protein, ur: NEGATIVE mg/dL
Specific Gravity, Urine: 1.026 (ref 1.005–1.030)

## 2015-05-02 LAB — CBC
HEMATOCRIT: 43.9 % (ref 39.0–52.0)
HEMOGLOBIN: 15 g/dL (ref 13.0–17.0)
MCH: 31.8 pg (ref 26.0–34.0)
MCHC: 34.2 g/dL (ref 30.0–36.0)
MCV: 93 fL (ref 78.0–100.0)
Platelets: 196 10*3/uL (ref 150–400)
RBC: 4.72 MIL/uL (ref 4.22–5.81)
RDW: 11.9 % (ref 11.5–15.5)
WBC: 8.2 10*3/uL (ref 4.0–10.5)

## 2015-05-02 LAB — PULMONARY FUNCTION TEST
DL/VA % PRED: 116 %
DL/VA: 5.41 ml/min/mmHg/L
DLCO COR: 32.81 ml/min/mmHg
DLCO UNC % PRED: 102 %
DLCO cor % pred: 101 %
DLCO unc: 33.18 ml/min/mmHg
FEF 25-75 PRE: 5.81 L/s
FEF 25-75 Post: 6.8 L/sec
FEF2575-%CHANGE-POST: 17 %
FEF2575-%PRED-PRE: 156 %
FEF2575-%Pred-Post: 183 %
FEV1-%Change-Post: 3 %
FEV1-%PRED-PRE: 94 %
FEV1-%Pred-Post: 97 %
FEV1-POST: 3.96 L
FEV1-PRE: 3.85 L
FEV1FVC-%CHANGE-POST: 5 %
FEV1FVC-%Pred-Pre: 109 %
FEV6-%CHANGE-POST: -1 %
FEV6-%PRED-POST: 86 %
FEV6-%PRED-PRE: 87 %
FEV6-POST: 4.35 L
FEV6-PRE: 4.42 L
FEV6FVC-%Change-Post: 0 %
FEV6FVC-%PRED-POST: 103 %
FEV6FVC-%PRED-PRE: 102 %
FVC-%Change-Post: -2 %
FVC-%PRED-POST: 84 %
FVC-%Pred-Pre: 85 %
FVC-Post: 4.35 L
FVC-Pre: 4.45 L
POST FEV6/FVC RATIO: 100 %
PRE FEV1/FVC RATIO: 86 %
Post FEV1/FVC ratio: 91 %
Pre FEV6/FVC Ratio: 100 %
RV % PRED: 62 %
RV: 1.21 L
TLC % PRED: 85 %
TLC: 5.94 L

## 2015-05-02 LAB — COMPREHENSIVE METABOLIC PANEL
ALBUMIN: 4.3 g/dL (ref 3.5–5.0)
ALK PHOS: 46 U/L (ref 38–126)
ALT: 45 U/L (ref 17–63)
AST: 26 U/L (ref 15–41)
Anion gap: 13 (ref 5–15)
BILIRUBIN TOTAL: 1.2 mg/dL (ref 0.3–1.2)
BUN: 14 mg/dL (ref 6–20)
CALCIUM: 9.6 mg/dL (ref 8.9–10.3)
CO2: 21 mmol/L — ABNORMAL LOW (ref 22–32)
Chloride: 106 mmol/L (ref 101–111)
Creatinine, Ser: 0.82 mg/dL (ref 0.61–1.24)
GFR calc Af Amer: >60 mL/min (ref >60)
GFR calc non Af Amer: >60 mL/min (ref >60)
GLUCOSE: 103 mg/dL — AB (ref 65–99)
Potassium: 4.1 mmol/L (ref 3.5–5.1)
Sodium: 140 mmol/L (ref 135–145)
TOTAL PROTEIN: 7.3 g/dL (ref 6.5–8.1)

## 2015-05-02 LAB — BLOOD GAS, ARTERIAL
Acid-Base Excess: 1.6 mmol/L (ref 0.0–2.0)
Bicarbonate: 25.8 mEq/L — ABNORMAL HIGH (ref 20.0–24.0)
DRAWN BY: 206361
FIO2: 0.21
O2 Saturation: 96.9 %
PATIENT TEMPERATURE: 98.6
PH ART: 7.405 (ref 7.350–7.450)
TCO2: 27.1 mmol/L (ref 0–100)
pCO2 arterial: 42.1 mmHg (ref 35.0–45.0)
pO2, Arterial: 93.5 mmHg (ref 80.0–100.0)

## 2015-05-02 LAB — SURGICAL PCR SCREEN
MRSA, PCR: NEGATIVE
Staphylococcus aureus: POSITIVE — AB

## 2015-05-02 LAB — PROTIME-INR
INR: 1.01 (ref 0.00–1.49)
Prothrombin Time: 13.5 seconds (ref 11.6–15.2)

## 2015-05-02 LAB — APTT: aPTT: 25 seconds (ref 24–37)

## 2015-05-02 LAB — TYPE AND SCREEN
ABO/RH(D): AB POS
ANTIBODY SCREEN: NEGATIVE

## 2015-05-02 LAB — ABO/RH: ABO/RH(D): AB POS

## 2015-05-02 MED ORDER — ALBUTEROL SULFATE (2.5 MG/3ML) 0.083% IN NEBU
2.5000 mg | INHALATION_SOLUTION | Freq: Once | RESPIRATORY_TRACT | Status: AC
  Administered 2015-05-02: 2.5 mg via RESPIRATORY_TRACT

## 2015-05-02 NOTE — Pre-Procedure Instructions (Addendum)
Danny Mendoza  05/02/2015      [facility] - [place] ([place]), [place] - [address] [place] ([place]) [place] [address] Phone: [phone] Fax: [phone]    Your procedure is scheduled on 05/04/15  Report to [facility] Admitting at 630 A.M.  Call this number if you have problems the morning of surgery:  [phone]   Remember:  Do not eat food or drink liquids after midnight.  Take these medicines the morning of surgery with A SIP OF WATER metoprolol, protonix, nitro if needed  STOP all herbel meds, nsaids (aleve,naproxen,advil,ibuprofen) Today including vitamins   effient per dr 04/27/15 last dose     Do not wear jewelry, make-up or nail polish.  Do not wear lotions, powders, or perfumes.  You may wear deodorant.  Do not shave 48 hours prior to surgery.  Men may shave face and neck.  Do not bring valuables to the hospital.  [facility] is not responsible for any belongings or valuables.  Contacts, dentures or bridgework may not be worn into surgery.  Leave your suitcase in the car.  After surgery it may be brought to your room.  For patients admitted to the hospital, discharge time will be determined by your treatment team.  Patients discharged the day of surgery will not be allowed to drive home.   Name and phone number of your driver:    Special instructions:   Special Instructions:  - Preparing for Surgery  Before surgery, you can play an important role.  Because skin is not sterile, your skin needs to be as free of germs as possible.  You can reduce the number of germs on you skin by washing with CHG (chlorahexidine gluconate) soap before surgery.  CHG is an antiseptic cleaner which kills germs and bonds with the skin to continue killing germs even after washing.  Please DO NOT use if you have an allergy to CHG or antibacterial soaps.  If your skin becomes reddened/irritated stop using the CHG and inform your nurse  when you arrive at Short Stay.  Do not shave (including legs and underarms) for at least 48 hours prior to the first CHG shower.  You may shave your face.  Please follow these instructions carefully:   1.  Shower with CHG Soap the night before surgery and the morning of Surgery.  2.  If you choose to wash your hair, wash your hair first as usual with your normal shampoo.  3.  After you shampoo, rinse your hair and body thoroughly to remove the Shampoo.  4.  Use CHG as you would any other liquid soap.  You can apply chg directly  to the skin and wash gently with scrungie or a clean washcloth.  5.  Apply the CHG Soap to your body ONLY FROM THE NECK DOWN.  Do not use on open wounds or open sores.  Avoid contact with your eyes ears, mouth and genitals (private parts).  Wash genitals (private parts)       with your normal soap.  6.  Wash thoroughly, paying special attention to the area where your surgery will be performed.  7.  Thoroughly rinse your body with warm water from the neck down.  8.  DO NOT shower/wash with your normal soap after using and rinsing off the CHG Soap.  9.  Pat yourself dry with a clean towel.  10.  Wear clean pajamas.            11.  Place clean sheets on your bed the night of your first shower and do not sleep with pets.  Day of Surgery  Do not apply any lotions/deodorants the morning of surgery.  Please wear clean clothes to the hospital/surgery center.  Please read over the following fact sheets that you were given. Pain Booklet, Coughing and Deep Breathing, Blood Transfusion Information, MRSA Information and Surgical Site Infection Prevention

## 2015-05-02 NOTE — Progress Notes (Addendum)
VASCULAR LAB PRELIMINARY  PRELIMINARY  PRELIMINARY  PRELIMINARY  Pre-op Cardiac Surgery  Carotid Findings:  Bilateral:  1-39% ICA stenosis.  Vertebral artery flow is antegrade.     Upper Extremity Right Left  Brachial Pressures 155 Triphasic 138 Triphasic  Radial Waveforms Triphasic Triohasic  Ulnar Waveforms Triphasic Triphasic  Palmar Arch (Allen's Test) Normal Normal   Findings:  Doppler waveforms remained normal bilaterally with both radial and ulnar compressions    Lower  Extremity Right Left  Dorsalis Pedis 167 Triphasic 159 Triphasic  Posterior Tibial 181 Triphasic 180 Triphasic  Ankle/Brachial Indices 1.17 1.16    Findings:  Doppler waveforms and ABIs are within normal limits bilaterally at rest.   Danny Mendoza, RVS 05/02/2015, 2:01 PM

## 2015-05-03 ENCOUNTER — Encounter (HOSPITAL_COMMUNITY): Payer: Self-pay

## 2015-05-03 LAB — HEMOGLOBIN A1C
HEMOGLOBIN A1C: 5.8 % — AB (ref 4.8–5.6)
MEAN PLASMA GLUCOSE: 120 mg/dL

## 2015-05-03 MED ORDER — AMINOCAPROIC ACID 250 MG/ML IV SOLN
INTRAVENOUS | Status: DC
  Filled 2015-05-03: qty 40

## 2015-05-03 MED ORDER — EPINEPHRINE HCL 1 MG/ML IJ SOLN
0.0000 ug/min | INTRAVENOUS | Status: DC
  Filled 2015-05-03: qty 4

## 2015-05-03 MED ORDER — DEXTROSE 5 % IV SOLN
1.5000 g | INTRAVENOUS | Status: AC
  Administered 2015-05-04: .75 g via INTRAVENOUS
  Administered 2015-05-04: 1.5 g via INTRAVENOUS
  Filled 2015-05-03: qty 1.5

## 2015-05-03 MED ORDER — CHLORHEXIDINE GLUCONATE 4 % EX LIQD: 30.0000 mL | CUTANEOUS | Status: DC

## 2015-05-03 MED ORDER — POTASSIUM CHLORIDE 2 MEQ/ML IV SOLN
80.0000 meq | INTRAVENOUS | Status: DC
  Filled 2015-05-03: qty 40

## 2015-05-03 MED ORDER — VANCOMYCIN HCL 10 G IV SOLR
1500.0000 mg | INTRAVENOUS | Status: AC
  Administered 2015-05-04: 1500 mg via INTRAVENOUS
  Filled 2015-05-03: qty 1500

## 2015-05-03 MED ORDER — DEXMEDETOMIDINE HCL IN NACL 400 MCG/100ML IV SOLN
0.1000 ug/kg/h | INTRAVENOUS | Status: DC
  Filled 2015-05-03: qty 100

## 2015-05-03 MED ORDER — CHLORHEXIDINE GLUCONATE 0.12 % MT SOLN
15.0000 mL | Freq: Once | OROMUCOSAL | Status: AC
  Administered 2015-05-04: 15 mL via OROMUCOSAL
  Filled 2015-05-03: qty 15

## 2015-05-03 MED ORDER — MAGNESIUM SULFATE 50 % IJ SOLN
40.0000 meq | INTRAMUSCULAR | Status: DC
  Filled 2015-05-03: qty 10

## 2015-05-03 MED ORDER — INSULIN REGULAR HUMAN 100 UNIT/ML IJ SOLN
INTRAMUSCULAR | Status: DC
  Filled 2015-05-03 (×2): qty 2.5

## 2015-05-03 MED ORDER — DOPAMINE-DEXTROSE 3.2-5 MG/ML-% IV SOLN
0.0000 ug/kg/min | INTRAVENOUS | Status: DC
  Filled 2015-05-03: qty 250

## 2015-05-03 MED ORDER — NITROGLYCERIN IN D5W 200-5 MCG/ML-% IV SOLN
2.0000 ug/min | INTRAVENOUS | Status: AC
  Administered 2015-05-04: 10 ug/min via INTRAVENOUS
  Filled 2015-05-03: qty 250

## 2015-05-03 MED ORDER — SODIUM CHLORIDE 0.9 % IV SOLN
INTRAVENOUS | Status: DC
  Filled 2015-05-03: qty 30

## 2015-05-03 MED ORDER — DEXTROSE 5 % IV SOLN
30.0000 ug/min | INTRAVENOUS | Status: DC
  Filled 2015-05-03 (×2): qty 2

## 2015-05-03 MED ORDER — PLASMA-LYTE 148 IV SOLN
INTRAVENOUS | Status: AC
  Administered 2015-05-04: 500 mL
  Filled 2015-05-03: qty 2.5

## 2015-05-03 MED ORDER — METOPROLOL TARTRATE 12.5 MG HALF TABLET: 12.5000 mg | ORAL_TABLET | Freq: Once | ORAL | Status: DC

## 2015-05-03 MED ORDER — DEXTROSE 5 % IV SOLN
750.0000 mg | INTRAVENOUS | Status: DC
  Filled 2015-05-03: qty 750

## 2015-05-03 NOTE — Progress Notes (Signed)
Anesthesia Chart Review: Patient is a 45 year old male scheduled for CABG on 05/04/15 by Dr. Dorris Fetch.  History includes inferior-posterior STEMI 03/22/15 s/p BMS CX [facility], Dr. Lorella Nimrod) with 3VCAD and future CABG recommended, recent former smoker (quit 03/22/15), HTN, dyslipidemia. No PCP. Cardiologist is Dr. Algie Coffer.   Meds include ASA, Lipitor, lisinopril, Nitro, Protonix, Effient (on hold since 04/26/15).  03/22/15 Cardiac cath [facility]; scanned under Media tab): Dominant RCA with eccentric complex 75% stenosis in the distal RCA to the PDA and PL branch. 85% PL branch. LAD originates from LM. Small diseased DIAG1 with complex 80%. The bifurcation of DIAG2. LAD is involved proximal and distal to bifurcation. 80% ostial DIAG with plaque extending to the mid LAD. LCX occluded. S/p PTCA/3X20 Rebel BMS proximal CX.  03/22/15 Echo ([place], see Care Everywhere): Summary There is normal left ventricular size with top normal LV wall thickness. Inferolateral, lateral hypokinesis to akinesis, LVEF 50%. Impaired relaxation, grade 1 diastolic dysfunction. Normal right ventricular size and function. The left atrium is normal in size. No valve abnormalities.  05/02/15 Carotid U/S (Preliminary): Bilateral 1-29% ICA stenosis. Antegrade vertebral flow.  05/02/15 PFTs: FVC 4.45 (85%), FEV1 3.85 (94%), DLCOunc 33.18 (102%).  Preoperative EKG, CXR, labs noted.   If no acute changes then I anticipate that he can proceed as planned.  Velna Ochs [facility] Short Stay Center/Anesthesiology Phone [phone] 05/03/2015 9:55 AM

## 2015-05-04 ENCOUNTER — Inpatient Hospital Stay (HOSPITAL_COMMUNITY): Payer: Commercial Managed Care - HMO

## 2015-05-04 ENCOUNTER — Encounter (HOSPITAL_COMMUNITY): Payer: Self-pay | Admitting: Certified Registered Nurse Anesthetist

## 2015-05-04 ENCOUNTER — Inpatient Hospital Stay (HOSPITAL_COMMUNITY)
Admission: RE | Admit: 2015-05-04 | Discharge: 2015-05-09 | DRG: 236 | Disposition: A | Discharge status: Home / Self Care | Payer: Commercial Managed Care - HMO | Source: Ambulatory Visit | Attending: Thoracic Surgery (Cardiothoracic Vascular Surgery) | Admitting: Thoracic Surgery (Cardiothoracic Vascular Surgery)

## 2015-05-04 ENCOUNTER — Inpatient Hospital Stay (HOSPITAL_COMMUNITY): Payer: Commercial Managed Care - HMO | Admitting: Anesthesiology

## 2015-05-04 ENCOUNTER — Encounter (HOSPITAL_COMMUNITY)
Admission: RE | Disposition: A | Discharge status: Home / Self Care | Payer: Self-pay | Source: Ambulatory Visit | Attending: Thoracic Surgery (Cardiothoracic Vascular Surgery)

## 2015-05-04 DIAGNOSIS — Z7982 Long term (current) use of aspirin: Secondary | ICD-10-CM

## 2015-05-04 DIAGNOSIS — Z955 Presence of coronary angioplasty implant and graft: Secondary | ICD-10-CM

## 2015-05-04 DIAGNOSIS — Z79899 Other long term (current) drug therapy: Secondary | ICD-10-CM

## 2015-05-04 DIAGNOSIS — R7303 Prediabetes: Secondary | ICD-10-CM | POA: Diagnosis present

## 2015-05-04 DIAGNOSIS — J9811 Atelectasis: Secondary | ICD-10-CM | POA: Diagnosis not present

## 2015-05-04 DIAGNOSIS — I2510 Atherosclerotic heart disease of native coronary artery without angina pectoris: Principal | ICD-10-CM | POA: Diagnosis present

## 2015-05-04 DIAGNOSIS — D62 Acute posthemorrhagic anemia: Secondary | ICD-10-CM | POA: Diagnosis not present

## 2015-05-04 DIAGNOSIS — I10 Essential (primary) hypertension: Secondary | ICD-10-CM | POA: Diagnosis present

## 2015-05-04 DIAGNOSIS — K219 Gastro-esophageal reflux disease without esophagitis: Secondary | ICD-10-CM | POA: Diagnosis present

## 2015-05-04 DIAGNOSIS — I319 Disease of pericardium, unspecified: Secondary | ICD-10-CM | POA: Diagnosis not present

## 2015-05-04 DIAGNOSIS — Z87891 Personal history of nicotine dependence: Secondary | ICD-10-CM | POA: Diagnosis not present

## 2015-05-04 DIAGNOSIS — I213 ST elevation (STEMI) myocardial infarction of unspecified site: Secondary | ICD-10-CM

## 2015-05-04 DIAGNOSIS — Z951 Presence of aortocoronary bypass graft: Secondary | ICD-10-CM

## 2015-05-04 DIAGNOSIS — I252 Old myocardial infarction: Secondary | ICD-10-CM | POA: Diagnosis not present

## 2015-05-04 DIAGNOSIS — E785 Hyperlipidemia, unspecified: Secondary | ICD-10-CM | POA: Diagnosis present

## 2015-05-04 DIAGNOSIS — E8770 Fluid overload, unspecified: Secondary | ICD-10-CM | POA: Diagnosis not present

## 2015-05-04 DIAGNOSIS — R195 Other fecal abnormalities: Secondary | ICD-10-CM | POA: Diagnosis not present

## 2015-05-04 HISTORY — PX: CORONARY ARTERY BYPASS GRAFT: SHX141

## 2015-05-04 HISTORY — PX: TEE WITHOUT CARDIOVERSION: SHX5443

## 2015-05-04 LAB — CBC
HCT: 32.3 % — ABNORMAL LOW (ref 39.0–52.0)
HCT: 35.2 % — ABNORMAL LOW (ref 39.0–52.0)
Hemoglobin: 11.1 g/dL — ABNORMAL LOW (ref 13.0–17.0)
Hemoglobin: 11.9 g/dL — ABNORMAL LOW (ref 13.0–17.0)
MCH: 30.8 pg (ref 26.0–34.0)
MCH: 31.1 pg (ref 26.0–34.0)
MCHC: 34.4 g/dL (ref 30.0–36.0)
MCHC: 33.8 g/dL (ref 30.0–36.0)
MCV: 89.7 fL (ref 78.0–100.0)
MCV: 91.9 fL (ref 78.0–100.0)
PLATELETS: 128 10*3/uL — AB (ref 150–400)
PLATELETS: 175 10*3/uL (ref 150–400)
RBC: 3.6 MIL/uL — AB (ref 4.22–5.81)
RBC: 3.83 MIL/uL — ABNORMAL LOW (ref 4.22–5.81)
RDW: 11.7 % (ref 11.5–15.5)
RDW: 12.2 % (ref 11.5–15.5)
WBC: 10.8 10*3/uL — ABNORMAL HIGH (ref 4.0–10.5)
WBC: 12.7 10*3/uL — ABNORMAL HIGH (ref 4.0–10.5)

## 2015-05-04 LAB — POCT I-STAT 3, ART BLOOD GAS (G3+)
ACID-BASE DEFICIT: 1 mmol/L (ref 0.0–2.0)
ACID-BASE EXCESS: 4 mmol/L — AB (ref 0.0–2.0)
Acid-base deficit: 3 mmol/L — ABNORMAL HIGH (ref 0.0–2.0)
Acid-base deficit: 1 mmol/L (ref 0.0–2.0)
BICARBONATE: 28.9 meq/L — AB (ref 20.0–24.0)
BICARBONATE: 23.4 meq/L (ref 20.0–24.0)
BICARBONATE: 25 meq/L — AB (ref 20.0–24.0)
Bicarbonate: 23.5 mEq/L (ref 20.0–24.0)
O2 SAT: 99 %
O2 Saturation: 100 %
O2 Saturation: 99 %
O2 Saturation: 97 %
PCO2 ART: 44 mmHg (ref 35.0–45.0)
PCO2 ART: 33.1 mmHg — AB (ref 35.0–45.0)
PCO2 ART: 49.1 mmHg — AB (ref 35.0–45.0)
PCO2 ART: 50.8 mmHg — AB (ref 35.0–45.0)
PH ART: 7.425 (ref 7.350–7.450)
PH ART: 7.454 — AB (ref 7.350–7.450)
PO2 ART: 340 mmHg — AB (ref 80.0–100.0)
PO2 ART: 131 mmHg — AB (ref 80.0–100.0)
PO2 ART: 135 mmHg — AB (ref 80.0–100.0)
PO2 ART: 102 mmHg — AB (ref 80.0–100.0)
Patient temperature: 35.4
Patient temperature: 38
TCO2: 30 mmol/L (ref 0–100)
TCO2: 25 mmol/L (ref 0–100)
TCO2: 25 mmol/L (ref 0–100)
TCO2: 26 mmol/L (ref 0–100)
pH, Arterial: 7.288 — ABNORMAL LOW (ref 7.350–7.450)
pH, Arterial: 7.305 — ABNORMAL LOW (ref 7.350–7.450)

## 2015-05-04 LAB — POCT I-STAT, CHEM 8
BUN: 14 mg/dL (ref 6–20)
BUN: 12 mg/dL (ref 6–20)
BUN: 13 mg/dL (ref 6–20)
BUN: 12 mg/dL (ref 6–20)
BUN: 12 mg/dL (ref 6–20)
BUN: 12 mg/dL (ref 6–20)
CALCIUM ION: 1.18 mmol/L (ref 1.12–1.23)
CALCIUM ION: 1.05 mmol/L — AB (ref 1.12–1.23)
CALCIUM ION: 1.06 mmol/L — AB (ref 1.12–1.23)
CALCIUM ION: 1.08 mmol/L — AB (ref 1.12–1.23)
CHLORIDE: 102 mmol/L (ref 101–111)
CHLORIDE: 103 mmol/L (ref 101–111)
CHLORIDE: 99 mmol/L — AB (ref 101–111)
CHLORIDE: 99 mmol/L — AB (ref 101–111)
CHLORIDE: 105 mmol/L (ref 101–111)
CREATININE: 0.6 mg/dL — AB (ref 0.61–1.24)
CREATININE: 0.8 mg/dL (ref 0.61–1.24)
Calcium, Ion: 1.21 mmol/L (ref 1.12–1.23)
Calcium, Ion: 1.01 mmol/L — ABNORMAL LOW (ref 1.12–1.23)
Chloride: 102 mmol/L (ref 101–111)
Creatinine, Ser: 0.7 mg/dL (ref 0.61–1.24)
Creatinine, Ser: 0.6 mg/dL — ABNORMAL LOW (ref 0.61–1.24)
Creatinine, Ser: 0.7 mg/dL (ref 0.61–1.24)
Creatinine, Ser: 0.6 mg/dL — ABNORMAL LOW (ref 0.61–1.24)
GLUCOSE: 118 mg/dL — AB (ref 65–99)
Glucose, Bld: 97 mg/dL (ref 65–99)
Glucose, Bld: 89 mg/dL (ref 65–99)
Glucose, Bld: 99 mg/dL (ref 65–99)
Glucose, Bld: 119 mg/dL — ABNORMAL HIGH (ref 65–99)
Glucose, Bld: 131 mg/dL — ABNORMAL HIGH (ref 65–99)
HCT: 31 % — ABNORMAL LOW (ref 39.0–52.0)
HCT: 31 % — ABNORMAL LOW (ref 39.0–52.0)
HCT: 34 % — ABNORMAL LOW (ref 39.0–52.0)
HEMATOCRIT: 42 % (ref 39.0–52.0)
HEMATOCRIT: 31 % — AB (ref 39.0–52.0)
HEMATOCRIT: 39 % (ref 39.0–52.0)
HEMOGLOBIN: 14.3 g/dL (ref 13.0–17.0)
HEMOGLOBIN: 10.5 g/dL — AB (ref 13.0–17.0)
HEMOGLOBIN: 10.5 g/dL — AB (ref 13.0–17.0)
HEMOGLOBIN: 10.5 g/dL — AB (ref 13.0–17.0)
Hemoglobin: 13.3 g/dL (ref 13.0–17.0)
Hemoglobin: 11.6 g/dL — ABNORMAL LOW (ref 13.0–17.0)
POTASSIUM: 3.8 mmol/L (ref 3.5–5.1)
POTASSIUM: 3.5 mmol/L (ref 3.5–5.1)
POTASSIUM: 3.8 mmol/L (ref 3.5–5.1)
Potassium: 4.7 mmol/L (ref 3.5–5.1)
Potassium: 4.1 mmol/L (ref 3.5–5.1)
Potassium: 4.5 mmol/L (ref 3.5–5.1)
SODIUM: 141 mmol/L (ref 135–145)
SODIUM: 140 mmol/L (ref 135–145)
SODIUM: 141 mmol/L (ref 135–145)
SODIUM: 135 mmol/L (ref 135–145)
SODIUM: 139 mmol/L (ref 135–145)
SODIUM: 142 mmol/L (ref 135–145)
TCO2: 30 mmol/L (ref 0–100)
TCO2: 28 mmol/L (ref 0–100)
TCO2: 28 mmol/L (ref 0–100)
TCO2: 24 mmol/L (ref 0–100)
TCO2: 26 mmol/L (ref 0–100)
TCO2: 25 mmol/L (ref 0–100)

## 2015-05-04 LAB — PLATELET COUNT: Platelets: 171 10*3/uL (ref 150–400)

## 2015-05-04 LAB — CREATININE, SERUM
Creatinine, Ser: 0.97 mg/dL (ref 0.61–1.24)
GFR calc Af Amer: >60 mL/min (ref >60)
GFR calc non Af Amer: >60 mL/min (ref >60)

## 2015-05-04 LAB — HEMOGLOBIN AND HEMATOCRIT, BLOOD
HEMATOCRIT: 30.7 % — AB (ref 39.0–52.0)
HEMOGLOBIN: 10.6 g/dL — AB (ref 13.0–17.0)

## 2015-05-04 LAB — POCT I-STAT 4, (NA,K, GLUC, HGB,HCT)
GLUCOSE: 95 mg/dL (ref 65–99)
HEMATOCRIT: 32 % — AB (ref 39.0–52.0)
Hemoglobin: 10.9 g/dL — ABNORMAL LOW (ref 13.0–17.0)
POTASSIUM: 3.6 mmol/L (ref 3.5–5.1)
SODIUM: 140 mmol/L (ref 135–145)

## 2015-05-04 LAB — PROTIME-INR
INR: 1.4 (ref 0.00–1.49)
PROTHROMBIN TIME: 17.3 s — AB (ref 11.6–15.2)

## 2015-05-04 LAB — APTT: aPTT: 30 seconds (ref 24–37)

## 2015-05-04 LAB — MAGNESIUM: MAGNESIUM: 2.9 mg/dL — AB (ref 1.7–2.4)

## 2015-05-04 SURGERY — CORONARY ARTERY BYPASS GRAFTING (CABG)
Anesthesia: General | Site: Chest

## 2015-05-04 MED ORDER — EPHEDRINE SULFATE 50 MG/ML IJ SOLN
INTRAMUSCULAR | Status: AC
  Filled 2015-05-04: qty 1

## 2015-05-04 MED ORDER — SODIUM CHLORIDE 0.45 % IV SOLN: INTRAVENOUS | Status: DC | PRN

## 2015-05-04 MED ORDER — ALBUMIN HUMAN 5 % IV SOLN
INTRAVENOUS | Status: DC | PRN
  Administered 2015-05-04 (×2): via INTRAVENOUS

## 2015-05-04 MED ORDER — ASPIRIN EC 325 MG PO TBEC
325.0000 mg | DELAYED_RELEASE_TABLET | Freq: Every day | ORAL | Status: DC
  Administered 2015-05-05 – 2015-05-07 (×3): 325 mg via ORAL
  Filled 2015-05-04 (×3): qty 1

## 2015-05-04 MED ORDER — ARTIFICIAL TEARS OP OINT
TOPICAL_OINTMENT | OPHTHALMIC | Status: DC | PRN
  Administered 2015-05-04: 1 via OPHTHALMIC

## 2015-05-04 MED ORDER — FAMOTIDINE IN NACL 20-0.9 MG/50ML-% IV SOLN
20.0000 mg | Freq: Two times a day (BID) | INTRAVENOUS | Status: DC
  Administered 2015-05-04: 20 mg via INTRAVENOUS

## 2015-05-04 MED ORDER — PROPOFOL 10 MG/ML IV BOLUS
INTRAVENOUS | Status: AC
  Filled 2015-05-04: qty 20

## 2015-05-04 MED ORDER — CHLORHEXIDINE GLUCONATE 0.12% ORAL RINSE (MEDLINE KIT)
15.0000 mL | Freq: Two times a day (BID) | OROMUCOSAL | Status: DC
  Administered 2015-05-05: 15 mL via OROMUCOSAL

## 2015-05-04 MED ORDER — MUPIROCIN 2 % EX OINT
1.0000 | TOPICAL_OINTMENT | Freq: Two times a day (BID) | CUTANEOUS | Status: DC
Start: 2015-05-04 — End: 2015-05-06
  Administered 2015-05-04 – 2015-05-06 (×4): 1 via NASAL
  Filled 2015-05-04: qty 22

## 2015-05-04 MED ORDER — ALBUMIN HUMAN 5 % IV SOLN
250.0000 mL | INTRAVENOUS | Status: AC | PRN
  Administered 2015-05-04 – 2015-05-05 (×4): 250 mL via INTRAVENOUS
  Filled 2015-05-04 (×4): qty 250

## 2015-05-04 MED ORDER — ROCURONIUM BROMIDE 100 MG/10ML IV SOLN
INTRAVENOUS | Status: DC | PRN
  Administered 2015-05-04 (×4): 50 mg via INTRAVENOUS

## 2015-05-04 MED ORDER — PROPOFOL 10 MG/ML IV BOLUS
INTRAVENOUS | Status: DC | PRN
  Administered 2015-05-04: 30 mg via INTRAVENOUS

## 2015-05-04 MED ORDER — BISACODYL 5 MG PO TBEC
10.0000 mg | DELAYED_RELEASE_TABLET | Freq: Every day | ORAL | Status: DC
  Administered 2015-05-05 – 2015-05-09 (×5): 10 mg via ORAL
  Filled 2015-05-04 (×5): qty 2

## 2015-05-04 MED ORDER — LACTATED RINGERS IV SOLN: 500.0000 mL | Freq: Once | INTRAVENOUS | Status: DC | PRN

## 2015-05-04 MED ORDER — POTASSIUM CHLORIDE 10 MEQ/50ML IV SOLN
10.0000 meq | Freq: Once | INTRAVENOUS | Status: AC
  Administered 2015-05-04: 10 meq via INTRAVENOUS

## 2015-05-04 MED ORDER — MIDAZOLAM HCL 10 MG/2ML IJ SOLN
INTRAMUSCULAR | Status: AC
  Filled 2015-05-04: qty 2

## 2015-05-04 MED ORDER — LIDOCAINE HCL (CARDIAC) 20 MG/ML IV SOLN
INTRAVENOUS | Status: DC | PRN
  Administered 2015-05-04: 40 mg via INTRAVENOUS

## 2015-05-04 MED ORDER — 0.9 % SODIUM CHLORIDE (POUR BTL) OPTIME
TOPICAL | Status: DC | PRN
  Administered 2015-05-04: 6000 mL

## 2015-05-04 MED ORDER — SODIUM CHLORIDE 0.9% FLUSH: 3.0000 mL | INTRAVENOUS | Status: DC | PRN

## 2015-05-04 MED ORDER — SUCCINYLCHOLINE CHLORIDE 20 MG/ML IJ SOLN
INTRAMUSCULAR | Status: AC
Start: 2015-05-04 — End: 2015-05-04
  Filled 2015-05-04: qty 1

## 2015-05-04 MED ORDER — DEXMEDETOMIDINE HCL IN NACL 200 MCG/50ML IV SOLN
INTRAVENOUS | Status: AC
  Filled 2015-05-04: qty 50

## 2015-05-04 MED ORDER — DOCUSATE SODIUM 100 MG PO CAPS
200.0000 mg | ORAL_CAPSULE | Freq: Every day | ORAL | Status: DC
  Administered 2015-05-05 – 2015-05-09 (×5): 200 mg via ORAL
  Filled 2015-05-04 (×5): qty 2

## 2015-05-04 MED ORDER — SODIUM CHLORIDE 0.9 % IV SOLN
200.0000 ug | INTRAVENOUS | Status: DC | PRN
  Administered 2015-05-04: 0.2 ug/kg/h via INTRAVENOUS

## 2015-05-04 MED ORDER — CHLORHEXIDINE GLUCONATE 0.12 % MT SOLN
15.0000 mL | OROMUCOSAL | Status: AC
  Administered 2015-05-04: 15 mL via OROMUCOSAL

## 2015-05-04 MED ORDER — ROCURONIUM BROMIDE 50 MG/5ML IV SOLN
INTRAVENOUS | Status: AC
Start: 2015-05-04 — End: 2015-05-04
  Filled 2015-05-04: qty 1

## 2015-05-04 MED ORDER — ROCURONIUM BROMIDE 50 MG/5ML IV SOLN
INTRAVENOUS | Status: AC
  Filled 2015-05-04: qty 1

## 2015-05-04 MED ORDER — LACTATED RINGERS IV SOLN: INTRAVENOUS | Status: DC

## 2015-05-04 MED ORDER — VANCOMYCIN HCL IN DEXTROSE 1-5 GM/200ML-% IV SOLN
1000.0000 mg | Freq: Once | INTRAVENOUS | Status: AC
  Administered 2015-05-04: 1000 mg via INTRAVENOUS
  Filled 2015-05-04: qty 200

## 2015-05-04 MED ORDER — HEMOSTATIC AGENTS (NO CHARGE) OPTIME
TOPICAL | Status: DC | PRN
  Administered 2015-05-04: 1 via TOPICAL

## 2015-05-04 MED ORDER — ANTISEPTIC ORAL RINSE SOLUTION (CORINZ)
7.0000 mL | Freq: Four times a day (QID) | OROMUCOSAL | Status: DC
  Administered 2015-05-04 – 2015-05-05 (×3): 7 mL via OROMUCOSAL

## 2015-05-04 MED ORDER — DEXTROSE 5 % IV SOLN
0.0000 ug/min | INTRAVENOUS | Status: DC
  Administered 2015-05-05: 35 ug/min via INTRAVENOUS
  Filled 2015-05-04 (×3): qty 2

## 2015-05-04 MED ORDER — LACTATED RINGERS IV SOLN
INTRAVENOUS | Status: DC | PRN
  Administered 2015-05-04 (×2): via INTRAVENOUS

## 2015-05-04 MED ORDER — ARTIFICIAL TEARS OP OINT
TOPICAL_OINTMENT | OPHTHALMIC | Status: AC
  Filled 2015-05-04: qty 3.5

## 2015-05-04 MED ORDER — BISACODYL 10 MG RE SUPP: 10.0000 mg | Freq: Every day | RECTAL | Status: DC

## 2015-05-04 MED ORDER — POTASSIUM CHLORIDE 10 MEQ/50ML IV SOLN
10.0000 meq | INTRAVENOUS | Status: AC
  Administered 2015-05-04 (×3): 10 meq via INTRAVENOUS

## 2015-05-04 MED ORDER — MAGNESIUM SULFATE 4 GM/100ML IV SOLN
4.0000 g | Freq: Once | INTRAVENOUS | Status: AC
  Administered 2015-05-04: 4 g via INTRAVENOUS
  Filled 2015-05-04: qty 100

## 2015-05-04 MED ORDER — TRAMADOL HCL 50 MG PO TABS
50.0000 mg | ORAL_TABLET | ORAL | Status: DC | PRN
  Administered 2015-05-05 (×2): 100 mg via ORAL
  Administered 2015-05-06 (×2): 50 mg via ORAL
  Administered 2015-05-06: 100 mg via ORAL
  Filled 2015-05-04: qty 2
  Filled 2015-05-04 (×2): qty 1
  Filled 2015-05-04: qty 2
  Filled 2015-05-04: qty 1
  Filled 2015-05-04: qty 2

## 2015-05-04 MED ORDER — SODIUM CHLORIDE 0.9 % IV SOLN
INTRAVENOUS | Status: DC
  Administered 2015-05-05: 20 mL via INTRAVENOUS

## 2015-05-04 MED ORDER — ACETAMINOPHEN 160 MG/5ML PO SOLN: 650.0000 mg | Freq: Once | ORAL | Status: AC

## 2015-05-04 MED ORDER — METOPROLOL TARTRATE 25 MG/10 ML ORAL SUSPENSION: 12.5000 mg | Freq: Two times a day (BID) | ORAL | Status: DC

## 2015-05-04 MED ORDER — MIDAZOLAM HCL 2 MG/2ML IJ SOLN: 2.0000 mg | INTRAMUSCULAR | Status: DC | PRN

## 2015-05-04 MED ORDER — FENTANYL CITRATE (PF) 250 MCG/5ML IJ SOLN
INTRAMUSCULAR | Status: AC
  Filled 2015-05-04: qty 25

## 2015-05-04 MED ORDER — SODIUM CHLORIDE 0.9% FLUSH
3.0000 mL | Freq: Two times a day (BID) | INTRAVENOUS | Status: DC
  Administered 2015-05-05: 3 mL via INTRAVENOUS

## 2015-05-04 MED ORDER — VECURONIUM BROMIDE 10 MG IV SOLR
INTRAVENOUS | Status: AC
  Filled 2015-05-04: qty 10

## 2015-05-04 MED ORDER — MIDAZOLAM HCL 5 MG/5ML IJ SOLN
INTRAMUSCULAR | Status: DC | PRN
  Administered 2015-05-04: 2 mg via INTRAVENOUS
  Administered 2015-05-04: 1 mg via INTRAVENOUS
  Administered 2015-05-04: 3 mg via INTRAVENOUS
  Administered 2015-05-04 (×2): 2 mg via INTRAVENOUS

## 2015-05-04 MED ORDER — MIDAZOLAM HCL 10 MG/2ML IJ SOLN
INTRAMUSCULAR | Status: AC
Start: 2015-05-04 — End: 2015-05-04
  Filled 2015-05-04: qty 2

## 2015-05-04 MED ORDER — CHLORHEXIDINE GLUCONATE CLOTH 2 % EX PADS
6.0000 | MEDICATED_PAD | Freq: Every day | CUTANEOUS | Status: DC
Start: 2015-05-04 — End: 2015-05-06
  Administered 2015-05-04 – 2015-05-06 (×3): 6 via TOPICAL

## 2015-05-04 MED ORDER — MORPHINE SULFATE (PF) 2 MG/ML IV SOLN
1.0000 mg | INTRAVENOUS | Status: DC | PRN
  Filled 2015-05-04: qty 2

## 2015-05-04 MED ORDER — ASPIRIN 81 MG PO CHEW: 324.0000 mg | CHEWABLE_TABLET | Freq: Every day | ORAL | Status: DC

## 2015-05-04 MED ORDER — FENTANYL CITRATE (PF) 250 MCG/5ML IJ SOLN
INTRAMUSCULAR | Status: AC
  Filled 2015-05-04: qty 5

## 2015-05-04 MED ORDER — VECURONIUM BROMIDE 10 MG IV SOLR
INTRAVENOUS | Status: DC | PRN
  Administered 2015-05-04: 5 mg via INTRAVENOUS

## 2015-05-04 MED ORDER — ONDANSETRON HCL 4 MG/2ML IJ SOLN
4.0000 mg | Freq: Four times a day (QID) | INTRAMUSCULAR | Status: DC | PRN
  Administered 2015-05-05 – 2015-05-09 (×4): 4 mg via INTRAVENOUS
  Filled 2015-05-04 (×4): qty 2

## 2015-05-04 MED ORDER — GELATIN ABSORBABLE MT POWD
OROMUCOSAL | Status: DC | PRN
  Administered 2015-05-04 (×3): 4 mL via TOPICAL

## 2015-05-04 MED ORDER — FENTANYL CITRATE (PF) 100 MCG/2ML IJ SOLN
INTRAMUSCULAR | Status: DC | PRN
  Administered 2015-05-04: 50 ug via INTRAVENOUS
  Administered 2015-05-04: 150 ug via INTRAVENOUS
  Administered 2015-05-04: 900 ug via INTRAVENOUS
  Administered 2015-05-04 (×2): 50 ug via INTRAVENOUS
  Administered 2015-05-04: 100 ug via INTRAVENOUS

## 2015-05-04 MED ORDER — SODIUM CHLORIDE 0.9 % IV SOLN
INTRAVENOUS | Status: DC
  Filled 2015-05-04: qty 2.5

## 2015-05-04 MED ORDER — HEPARIN SODIUM (PORCINE) 1000 UNIT/ML IJ SOLN
INTRAMUSCULAR | Status: AC
  Filled 2015-05-04: qty 3

## 2015-05-04 MED ORDER — ACETAMINOPHEN 500 MG PO TABS
1000.0000 mg | ORAL_TABLET | Freq: Four times a day (QID) | ORAL | Status: DC
  Administered 2015-05-04 – 2015-05-09 (×18): 1000 mg via ORAL
  Filled 2015-05-04 (×19): qty 2

## 2015-05-04 MED ORDER — ACETAMINOPHEN 650 MG RE SUPP
650.0000 mg | Freq: Once | RECTAL | Status: AC
  Administered 2015-05-04: 650 mg via RECTAL

## 2015-05-04 MED ORDER — ATORVASTATIN CALCIUM 80 MG PO TABS
80.0000 mg | ORAL_TABLET | Freq: Every day | ORAL | Status: DC
Start: 2015-05-04 — End: 2015-05-09
  Administered 2015-05-05 – 2015-05-09 (×5): 80 mg via ORAL
  Filled 2015-05-04 (×6): qty 1

## 2015-05-04 MED ORDER — STERILE WATER FOR INJECTION IJ SOLN
INTRAMUSCULAR | Status: AC
  Filled 2015-05-04: qty 30

## 2015-05-04 MED ORDER — SODIUM CHLORIDE 0.9 % IV SOLN: 250.0000 mL | INTRAVENOUS | Status: DC

## 2015-05-04 MED ORDER — SODIUM CHLORIDE 0.9 % IV SOLN
250.0000 [IU] | INTRAVENOUS | Status: DC | PRN
  Administered 2015-05-04: .7 [IU]/h via INTRAVENOUS

## 2015-05-04 MED ORDER — ACETAMINOPHEN 160 MG/5ML PO SOLN
1000.0000 mg | Freq: Four times a day (QID) | ORAL | Status: DC
  Administered 2015-05-05: 1000 mg
  Filled 2015-05-04: qty 40.6

## 2015-05-04 MED ORDER — PROTAMINE SULFATE 10 MG/ML IV SOLN
INTRAVENOUS | Status: DC | PRN
  Administered 2015-05-04: 330 mg via INTRAVENOUS
  Administered 2015-05-04: 20 mg via INTRAVENOUS
  Administered 2015-05-04: 10 mg via INTRAVENOUS

## 2015-05-04 MED ORDER — DEXTROSE 5 % IV SOLN
20.0000 mg | INTRAVENOUS | Status: DC | PRN
  Administered 2015-05-04: 15 ug/min via INTRAVENOUS

## 2015-05-04 MED ORDER — DEXTROSE 5 % IV SOLN
1.5000 g | Freq: Two times a day (BID) | INTRAVENOUS | Status: AC
  Administered 2015-05-05 – 2015-05-06 (×4): 1.5 g via INTRAVENOUS
  Filled 2015-05-04 (×4): qty 1.5

## 2015-05-04 MED ORDER — SODIUM CHLORIDE 0.9 % IV SOLN
INTRAVENOUS | Status: DC | PRN
  Administered 2015-05-04: 13:00:00 via INTRAVENOUS

## 2015-05-04 MED ORDER — DEXMEDETOMIDINE HCL IN NACL 200 MCG/50ML IV SOLN: 0.0000 ug/kg/h | INTRAVENOUS | Status: DC

## 2015-05-04 MED ORDER — SODIUM CHLORIDE 0.9 % IV SOLN
10.0000 g | INTRAVENOUS | Status: DC | PRN
  Administered 2015-05-04: 5 g/h via INTRAVENOUS

## 2015-05-04 MED ORDER — METOPROLOL TARTRATE 12.5 MG HALF TABLET
12.5000 mg | ORAL_TABLET | Freq: Two times a day (BID) | ORAL | Status: DC
  Filled 2015-05-04: qty 1

## 2015-05-04 MED ORDER — METOPROLOL TARTRATE 1 MG/ML IV SOLN
2.5000 mg | INTRAVENOUS | Status: DC | PRN
  Administered 2015-05-06: 5 mg via INTRAVENOUS
  Filled 2015-05-04: qty 5

## 2015-05-04 MED ORDER — ROCURONIUM BROMIDE 50 MG/5ML IV SOLN
INTRAVENOUS | Status: AC
  Filled 2015-05-04: qty 3

## 2015-05-04 MED ORDER — HEPARIN SODIUM (PORCINE) 1000 UNIT/ML IJ SOLN
INTRAMUSCULAR | Status: DC | PRN
  Administered 2015-05-04: 2 mL via INTRAVENOUS
  Administered 2015-05-04: 38 mL via INTRAVENOUS

## 2015-05-04 MED ORDER — LACTATED RINGERS IV SOLN
INTRAVENOUS | Status: DC | PRN
Start: 2015-05-04 — End: 2015-05-04
  Administered 2015-05-04: 09:00:00 via INTRAVENOUS

## 2015-05-04 MED ORDER — LACTATED RINGERS IV SOLN
INTRAVENOUS | Status: DC | PRN
  Administered 2015-05-04: 08:00:00 via INTRAVENOUS

## 2015-05-04 MED ORDER — PANTOPRAZOLE SODIUM 40 MG PO TBEC
40.0000 mg | DELAYED_RELEASE_TABLET | Freq: Every day | ORAL | Status: DC
  Administered 2015-05-05 – 2015-05-08 (×4): 40 mg via ORAL
  Filled 2015-05-04 (×4): qty 1

## 2015-05-04 MED ORDER — INSULIN REGULAR BOLUS VIA INFUSION
0.0000 [IU] | Freq: Three times a day (TID) | INTRAVENOUS | Status: DC
  Filled 2015-05-04: qty 10

## 2015-05-04 MED ORDER — OXYCODONE HCL 5 MG PO TABS
5.0000 mg | ORAL_TABLET | ORAL | Status: DC | PRN
  Administered 2015-05-05 (×2): 10 mg via ORAL
  Administered 2015-05-05: 5 mg via ORAL
  Administered 2015-05-05 (×2): 10 mg via ORAL
  Administered 2015-05-06: 5 mg via ORAL
  Administered 2015-05-06: 10 mg via ORAL
  Administered 2015-05-06: 5 mg via ORAL
  Administered 2015-05-06 – 2015-05-07 (×3): 10 mg via ORAL
  Administered 2015-05-07 – 2015-05-09 (×2): 5 mg via ORAL
  Filled 2015-05-04 (×2): qty 2
  Filled 2015-05-04 (×3): qty 1
  Filled 2015-05-04 (×6): qty 2
  Filled 2015-05-04: qty 1
  Filled 2015-05-04 (×2): qty 2

## 2015-05-04 MED ORDER — SODIUM BICARBONATE 8.4 % IV SOLN
25.0000 meq | Freq: Once | INTRAVENOUS | Status: AC
Start: 2015-05-04 — End: 2015-05-04
  Administered 2015-05-04: 25 meq via INTRAVENOUS

## 2015-05-04 MED ORDER — MORPHINE SULFATE (PF) 2 MG/ML IV SOLN
2.0000 mg | INTRAVENOUS | Status: DC | PRN
  Administered 2015-05-04: 4 mg via INTRAVENOUS
  Administered 2015-05-04 – 2015-05-05 (×4): 2 mg via INTRAVENOUS
  Administered 2015-05-05 (×5): 4 mg via INTRAVENOUS
  Administered 2015-05-05 (×2): 2 mg via INTRAVENOUS
  Administered 2015-05-06: 4 mg via INTRAVENOUS
  Filled 2015-05-04 (×2): qty 2
  Filled 2015-05-04: qty 1
  Filled 2015-05-04: qty 2
  Filled 2015-05-04: qty 1
  Filled 2015-05-04: qty 2
  Filled 2015-05-04: qty 1
  Filled 2015-05-04 (×2): qty 2
  Filled 2015-05-04: qty 1
  Filled 2015-05-04: qty 2

## 2015-05-04 MED ORDER — NITROGLYCERIN IN D5W 200-5 MCG/ML-% IV SOLN: 0.0000 ug/min | INTRAVENOUS | Status: DC

## 2015-05-04 MED ORDER — INSULIN ASPART 100 UNIT/ML ~~LOC~~ SOLN
0.0000 [IU] | SUBCUTANEOUS | Status: DC
  Administered 2015-05-04 – 2015-05-05 (×4): 2 [IU] via SUBCUTANEOUS

## 2015-05-04 MED FILL — Sodium Bicarbonate IV Soln 8.4%: INTRAVENOUS | Qty: 50 | Status: AC

## 2015-05-04 MED FILL — Heparin Sodium (Porcine) Inj 1000 Unit/ML: INTRAMUSCULAR | Qty: 30 | Status: AC

## 2015-05-04 MED FILL — Heparin Sodium (Porcine) Inj 1000 Unit/ML: INTRAMUSCULAR | Qty: 10 | Status: AC

## 2015-05-04 MED FILL — Mannitol IV Soln 20%: INTRAVENOUS | Qty: 500 | Status: AC

## 2015-05-04 MED FILL — Sodium Chloride IV Soln 0.9%: INTRAVENOUS | Qty: 2000 | Status: AC

## 2015-05-04 MED FILL — Electrolyte-R (PH 7.4) Solution: INTRAVENOUS | Qty: 4000 | Status: AC

## 2015-05-04 MED FILL — Magnesium Sulfate Inj 50%: INTRAMUSCULAR | Qty: 10 | Status: AC

## 2015-05-04 MED FILL — Potassium Chloride Inj 2 mEq/ML: INTRAVENOUS | Qty: 40 | Status: AC

## 2015-05-04 SURGICAL SUPPLY — 87 items
BAG DECANTER FOR FLEXI CONT (MISCELLANEOUS) ×4 IMPLANT
BANDAGE ACE 4X5 VEL STRL LF (GAUZE/BANDAGES/DRESSINGS) ×2 IMPLANT
BANDAGE ACE 6X5 VEL STRL LF (GAUZE/BANDAGES/DRESSINGS) ×2 IMPLANT
BANDAGE ELASTIC 4 VELCRO ST LF (GAUZE/BANDAGES/DRESSINGS) ×4 IMPLANT
BANDAGE ELASTIC 6 VELCRO ST LF (GAUZE/BANDAGES/DRESSINGS) ×4 IMPLANT
BASKET HEART  (ORDER IN 25'S) (MISCELLANEOUS) ×1
BASKET HEART (ORDER IN 25'S) (MISCELLANEOUS) ×1
BASKET HEART (ORDER IN 25S) (MISCELLANEOUS) ×2 IMPLANT
BLADE STERNUM SYSTEM 6 (BLADE) ×4 IMPLANT
BNDG GAUZE ELAST 4 BULKY (GAUZE/BANDAGES/DRESSINGS) ×4 IMPLANT
CANISTER SUCTION 2500CC (MISCELLANEOUS) ×4 IMPLANT
CANNULA EZ GLIDE AORTIC 21FR (CANNULA) ×4 IMPLANT
CATH CPB KIT HENDRICKSON (MISCELLANEOUS) ×4 IMPLANT
CATH ROBINSON RED A/P 18FR (CATHETERS) ×4 IMPLANT
CATH THORACIC 36FR (CATHETERS) ×4 IMPLANT
CATH THORACIC 36FR RT ANG (CATHETERS) ×4 IMPLANT
CLIP FOGARTY SPRING 6M (CLIP) ×2 IMPLANT
CLIP TI MEDIUM 24 (CLIP) IMPLANT
CLIP TI WIDE RED SMALL 24 (CLIP) ×6 IMPLANT
CRADLE DONUT ADULT HEAD (MISCELLANEOUS) ×4 IMPLANT
DRAPE CARDIOVASCULAR INCISE (DRAPES) ×4
DRAPE SLUSH/WARMER DISC (DRAPES) ×4 IMPLANT
DRAPE SRG 135X102X78XABS (DRAPES) ×2 IMPLANT
DRSG COVADERM 4X14 (GAUZE/BANDAGES/DRESSINGS) ×4 IMPLANT
ELECT REM PT RETURN 9FT ADLT (ELECTROSURGICAL) ×8
ELECTRODE REM PT RTRN 9FT ADLT (ELECTROSURGICAL) ×4 IMPLANT
GAUZE SPONGE 4X4 12PLY STRL (GAUZE/BANDAGES/DRESSINGS) ×8 IMPLANT
GLOVE BIO SURGEON STRL SZ 6.5 (GLOVE) ×5 IMPLANT
GLOVE BIO SURGEON STRL SZ7 (GLOVE) ×12 IMPLANT
GLOVE BIO SURGEONS STRL SZ 6.5 (GLOVE) ×5
GLOVE SURG SIGNA 7.5 PF LTX (GLOVE) ×12 IMPLANT
GOWN STRL REUS W/ TWL LRG LVL3 (GOWN DISPOSABLE) ×8 IMPLANT
GOWN STRL REUS W/ TWL XL LVL3 (GOWN DISPOSABLE) ×4 IMPLANT
GOWN STRL REUS W/TWL LRG LVL3 (GOWN DISPOSABLE) ×36
GOWN STRL REUS W/TWL XL LVL3 (GOWN DISPOSABLE) ×8
HEMOSTAT POWDER SURGIFOAM 1G (HEMOSTASIS) ×12 IMPLANT
HEMOSTAT SURGICEL 2X14 (HEMOSTASIS) ×4 IMPLANT
INSERT FOGARTY XLG (MISCELLANEOUS) IMPLANT
KIT BASIN OR (CUSTOM PROCEDURE TRAY) ×4 IMPLANT
KIT ROOM TURNOVER OR (KITS) ×4 IMPLANT
KIT SUCTION CATH 14FR (SUCTIONS) ×8 IMPLANT
KIT VASOVIEW W/TROCAR VH 2000 (KITS) ×4 IMPLANT
LIQUID BAND (GAUZE/BANDAGES/DRESSINGS) ×2 IMPLANT
MARKER GRAFT CORONARY BYPASS (MISCELLANEOUS) ×12 IMPLANT
NS IRRIG 1000ML POUR BTL (IV SOLUTION) ×22 IMPLANT
PACK OPEN HEART (CUSTOM PROCEDURE TRAY) ×4 IMPLANT
PAD ARMBOARD 7.5X6 YLW CONV (MISCELLANEOUS) ×8 IMPLANT
PAD ELECT DEFIB RADIOL ZOLL (MISCELLANEOUS) ×4 IMPLANT
PENCIL BUTTON HOLSTER BLD 10FT (ELECTRODE) ×4 IMPLANT
PUNCH AORTIC ROTATE 4.0MM (MISCELLANEOUS) ×2 IMPLANT
PUNCH AORTIC ROTATE 4.5MM 8IN (MISCELLANEOUS) IMPLANT
PUNCH AORTIC ROTATE 5MM 8IN (MISCELLANEOUS) IMPLANT
SET CARDIOPLEGIA MPS 5001102 (MISCELLANEOUS) ×2 IMPLANT
SPONGE GAUZE 4X4 12PLY STER LF (GAUZE/BANDAGES/DRESSINGS) ×4 IMPLANT
SPONGE LAP 4X18 X RAY DECT (DISPOSABLE) ×2 IMPLANT
SUT BONE WAX W31G (SUTURE) ×4 IMPLANT
SUT MNCRL AB 4-0 PS2 18 (SUTURE) IMPLANT
SUT PROLENE 3 0 SH DA (SUTURE) ×4 IMPLANT
SUT PROLENE 4 0 RB 1 (SUTURE)
SUT PROLENE 4 0 SH DA (SUTURE) IMPLANT
SUT PROLENE 4-0 RB1 .5 CRCL 36 (SUTURE) IMPLANT
SUT PROLENE 6 0 C 1 30 (SUTURE) ×14 IMPLANT
SUT PROLENE 7 0 BV1 MDA (SUTURE) ×8 IMPLANT
SUT PROLENE 8 0 BV175 6 (SUTURE) ×2 IMPLANT
SUT STEEL 6MS V (SUTURE) ×4 IMPLANT
SUT STEEL STERNAL CCS#1 18IN (SUTURE) IMPLANT
SUT STEEL SZ 6 DBL 3X14 BALL (SUTURE) ×4 IMPLANT
SUT VIC AB 1 CTX 36 (SUTURE) ×8
SUT VIC AB 1 CTX36XBRD ANBCTR (SUTURE) ×4 IMPLANT
SUT VIC AB 2-0 CT1 27 (SUTURE) ×4
SUT VIC AB 2-0 CT1 TAPERPNT 27 (SUTURE) IMPLANT
SUT VIC AB 2-0 CTX 27 (SUTURE) IMPLANT
SUT VIC AB 3-0 SH 27 (SUTURE)
SUT VIC AB 3-0 SH 27X BRD (SUTURE) IMPLANT
SUT VIC AB 3-0 X1 27 (SUTURE) IMPLANT
SUT VICRYL 4-0 PS2 18IN ABS (SUTURE) ×2 IMPLANT
SUTURE E-PAK OPEN HEART (SUTURE) ×4 IMPLANT
SYSTEM SAHARA CHEST DRAIN ATS (WOUND CARE) ×4 IMPLANT
TAPE CLOTH SURG 4X10 WHT LF (GAUZE/BANDAGES/DRESSINGS) ×2 IMPLANT
TAPE PAPER 2X10 WHT MICROPORE (GAUZE/BANDAGES/DRESSINGS) ×2 IMPLANT
TOWEL OR 17X24 6PK STRL BLUE (TOWEL DISPOSABLE) ×8 IMPLANT
TOWEL OR 17X26 10 PK STRL BLUE (TOWEL DISPOSABLE) ×8 IMPLANT
TRAY FOLEY IC TEMP SENS 16FR (CATHETERS) ×4 IMPLANT
TUBE FEEDING 8FR 16IN STR KANG (MISCELLANEOUS) ×4 IMPLANT
TUBING INSUFFLATION (TUBING) ×4 IMPLANT
UNDERPAD 30X30 INCONTINENT (UNDERPADS AND DIAPERS) ×4 IMPLANT
WATER STERILE IRR 1000ML POUR (IV SOLUTION) ×8 IMPLANT

## 2015-05-04 NOTE — Progress Notes (Addendum)
Check patients gas after 20 minutes on PS/CPAP, notified Dr. Servando Snare of gas, Dr. Servando Snare gave verbal order to give a half amp of bicarb and ok to extubate if NIF and VC parameters are met.  NIF and VC good. Patient extubated to nasal cannula 2L. IS is currently 300. Will continue to monitor.  Rowe Pavy, RN

## 2015-05-04 NOTE — Progress Notes (Signed)
Patient ID: Danny Mendoza, male   DOB: 1970/06/08, 45 y.o.   MRN: [id number] EVENING ROUNDS NOTE :     [address]       [place] [address]             [phone]                 Day of Surgery Procedure(s) (LRB): CORONARY ARTERY BYPASS GRAFTING (CABG) x  five,  using left internal mammary artery and right leg greater saphenous vein harvested endoscopically (N/A) TRANSESOPHAGEAL ECHOCARDIOGRAM (TEE) (N/A)  Total Length of Stay:  LOS: 0 days  BP 89/68 mmHg  Pulse 87  Temp(Src) 99.3 F (37.4 C) (Oral)  Resp 14  Wt 201 lb (91.173 kg)  SpO2 99%  .Intake/Output      02/21 0701 - 02/22 0700 02/22 0701 - 02/23 0700   I.V. (mL/kg)  3290.5 (36.1)   Blood  400   NG/GT  30   IV Piggyback  850   Total Intake(mL/kg)  4570.5 (50.1)   Urine (mL/kg/hr)  2725 (2.6)   Emesis/NG output  50 (0)   Chest Tube  300 (0.3)   Total Output   3075   Net   +1495.5          . sodium chloride 20 mL/hr at 05/04/15 1500  . [START ON 05/05/2015] sodium chloride    . sodium chloride 10 mL/hr at 05/04/15 1437  . dexmedetomidine Stopped (05/04/15 1630)  . insulin (NOVOLIN-R) infusion Stopped (05/04/15 1446)  . lactated ringers Stopped (05/04/15 1447)  . lactated ringers 20 mL/hr at 05/04/15 1500  . nitroGLYCERIN Stopped (05/04/15 1452)  . phenylephrine (NEO-SYNEPHRINE) Adult infusion 25 mcg/min (05/04/15 1800)     Lab Results  Component Value Date   WBC 10.8* 05/04/2015   HGB 10.9* 05/04/2015   HCT 32.0* 05/04/2015   PLT 128* 05/04/2015   GLUCOSE 95 05/04/2015   CHOL 175 03/27/2015   TRIG 120 03/27/2015   HDL 32* 03/27/2015   LDLCALC 119* 03/27/2015   ALT 45 05/02/2015   AST 26 05/02/2015   NA 140 05/04/2015   K 3.6 05/04/2015   CL 102 05/04/2015   CREATININE 0.60* 05/04/2015   BUN 12 05/04/2015   CO2 21* 05/02/2015   INR 1.40 05/04/2015   HGBA1C 5.8* 05/02/2015   On vent intubated   Not bleeding  Delight Ovens MD  Beeper [phone] Office  [phone] 05/04/2015 6:38 PM

## 2015-05-04 NOTE — Progress Notes (Signed)
Danny Mendoza noted to be coiled in PA/ RVOT on CXR  refloated swan. It took several aatempts but finally into PA with good tracing.  PA 30/16  Salvatore Decent. Dorris Fetch, MD [facility] [phone]

## 2015-05-04 NOTE — Anesthesia Procedure Notes (Addendum)
Central Venous Catheter Insertion Performed by: anesthesiologist 05/04/2015 8:10 AM Patient location: Pre-op. Preanesthetic checklist: patient identified, IV checked, site marked, risks and benefits discussed, surgical consent, monitors and equipment checked, pre-op evaluation, timeout performed and anesthesia consent Position: Trendelenburg Lidocaine 1% used for infiltration Landmarks identified and Seldinger technique used Catheter size: 8.5 Fr Central line was placed.Sheath introducer Swan type and PA catheter depth:thermodilation and 50PA Cath depth:50 Procedure performed using ultrasound guided technique. Attempts: 1 Following insertion, line sutured and dressing applied. Post procedure assessment: blood return through all ports, free fluid flow and no air. Patient tolerated the procedure well with no immediate complications.   Procedure Name: Intubation Date/Time: 05/04/2015 8:59 AM Performed by: Rogelia Boga Pre-anesthesia Checklist: Patient identified, Emergency Drugs available, Suction available and Patient being monitored Patient Re-evaluated:Patient Re-evaluated prior to inductionOxygen Delivery Method: Circle system utilized Preoxygenation: Pre-oxygenation with 100% oxygen Intubation Type: IV induction Ventilation: Oral airway inserted - appropriate to patient size and Two handed mask ventilation required Laryngoscope Size: Mac and 3 Grade View: Grade I Tube type: Oral Tube size: 8.0 mm Number of attempts: 1 Airway Equipment and Method: Stylet Placement Confirmation: ETT inserted through vocal cords under direct vision,  positive ETCO2 and breath sounds checked- equal and bilateral Secured at: 22 cm Tube secured with: Tape Dental Injury: Teeth and Oropharynx as per pre-operative assessment  Comments: 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

## 2015-05-04 NOTE — Progress Notes (Signed)
Rapid cardiac wean  

## 2015-05-04 NOTE — Anesthesia Preprocedure Evaluation (Addendum)
Anesthesia Evaluation  Patient identified by MRN, date of birth, ID band Patient awake    Reviewed: Allergy & Precautions, NPO status , Patient's Chart, lab work & pertinent test results  Airway Mallampati: II  TM Distance: >3 FB Neck ROM: Full    Dental  (+) Teeth Intact, Edentulous Upper   Pulmonary former smoker,    breath sounds clear to auscultation       Cardiovascular hypertension, Pt. on medications and Pt. on home beta blockers + CAD and + Past MI   Rhythm:Regular Rate:Normal  1/17 ECHO: normal LVF, EF 50%, valves OK   Neuro/Psych    GI/Hepatic GERD  Medicated and Controlled,  Endo/Other    Renal/GU      Musculoskeletal   Abdominal Normal abdominal exam  (+)   Peds  Hematology   Anesthesia Other Findings   Reproductive/Obstetrics                          Anesthesia Physical Anesthesia Plan  ASA: III  Anesthesia Plan: General   Post-op Pain Management:    Induction: Intravenous  Airway Management Planned: Oral ETT  Additional Equipment: Arterial line, CVP and PA Cath  Intra-op Plan:   Post-operative Plan: Post-operative intubation/ventilation  Informed Consent: I have reviewed the patients History and Physical, chart, labs and discussed the procedure including the risks, benefits and alternatives for the proposed anesthesia with the patient or authorized representative who has indicated his/her understanding and acceptance.   Dental advisory given  Plan Discussed with: CRNA, Anesthesiologist and Surgeon  Anesthesia Plan Comments:         Anesthesia Quick Evaluation

## 2015-05-04 NOTE — Procedures (Signed)
Extubation Procedure Note  Patient Details:   Name: Danny Mendoza DOB: [birth year] MRN: [id number]   Airway Documentation:     Evaluation  O2 sats: stable throughout Complications: No apparent complications Patient did tolerate procedure well. Bilateral Breath Sounds: Clear Suctioning: Airway, Oral Yes, Pt. extubated w/o incident to 2 lpm humidified n/c, (+) cuff leak prior and was able to lift/hold head off bed, NIF/FVC WNL, no s/p Stridor noted after extubation and able to speak, RT to mnoitor.  Joylene John 05/04/2015, 7:51 PM

## 2015-05-04 NOTE — Op Note (Signed)
NAMEMERLE, CIRELLI NO.:  [id number]  MEDICAL RECORD NO.:  [id number]  LOCATION:  2S01C                        FACILITY:  [facility]  PHYSICIAN:  Salvatore Decent. Dorris Fetch, M.D.DATE OF BIRTH:  Apr 13, 1970  DATE OF PROCEDURE:  05/04/2015 DATE OF DISCHARGE:                              OPERATIVE REPORT   PREOPERATIVE DIAGNOSIS:  Three-vessel coronary artery disease with recent ST-elevation myocardial infarction.  POSTOPERATIVE DIAGNOSIS:  Three-vessel coronary artery disease with recent ST-elevation myocardial infarction.  PROCEDURE:   Median sternotomy, extracorporeal circulation Coronary artery bypass grafting x 5   Left internal mammary artery to left anterior descending coronary artery,  Saphenous vein graft to 2nd diagonal,  Saphenous vein graft to obtuse marginal 1  Sequential saphenous vein graft to posterior descending and posterior   lateral Endoscopic vein harvest right leg.  SURGEON:  Salvatore Decent. Dorris Fetch, MD  ASSISTANT:  Doree Fudge, PA  ANESTHESIA:  General.  FINDINGS:  Good quality conduits.  LAD, diagonal, and OM good quality targets.  PD and PL fair quality targets.  Transesophageal echocardiography revealed preserved left ventricular function both pre and post bypass.  No significant valvular pathology.  CLINICAL NOTE:  Mr. Posten is a 45 year old man who had an ST-elevation MI in [place] in early January due to total occlusion of his left circumflex, this was stented acutely with a bare metal stent.  At catheterization, he had severe LAD and right coronary disease and also had some moderate plaque in the proximal circumflex.  He was advised to undergo coronary artery bypass grafting for survival benefit.  The indications, risks, benefits, and alternatives were discussed in detail with the patient.  He understood and accepted the risks and agreed to proceed.  OPERATIVE NOTE:  Mr. Soberano was brought to the preoperative holding  area on May 04, 2015.  Anesthesia placed a Swan-Ganz catheter and arterial blood pressure monitoring line.  He was taken to the operating room, anesthetized, and intubated.  A Foley catheter was placed. Intravenous antibiotics were administered.  The chest, abdomen, and legs were prepped and draped in usual sterile fashion.  Transesophageal echocardiography was performed.  It revealed preserved left ventricular function with no significant valvular pathology.  A median sternotomy was performed and the left internal mammary artery was harvested using standard technique.  Simultaneously, an incision was made in the medial aspect of the right leg at the level of the knee. The greater saphenous vein was harvested from mid calf to groin.  Both the mammary artery and saphenous vein were good quality conduits. 2000 units of heparin was administered during the vessel harvest.  The remainder of full heparin dose was given prior to opening the pericardium.  After harvesting the conduits, the pericardium was opened.  The ascending aorta was inspected.  It was of normal size with no atherosclerotic disease evident.  After confirming adequate anticoagulation with ACT measurement, the aorta was cannulated via concentric 2-0 Ethibond pledgeted pursestring sutures.  A dual-stage venous cannula was placed via a pursestring suture in the right atrial appendage.  Cardiopulmonary bypass was initiated.  Flows were maintained per protocol.  The patient was cooled to 32 degrees Celsius.  The coronary arteries were inspected and anastomotic  sites were chosen.  The conduits were inspected and cut to length.  A foam pad was placed in the pericardium to insulate the heart and protect the left phrenic nerve.  A temperature probe was placed in myocardial septum and a cardioplegia cannula was placed in the ascending aorta.  The aorta was crossclamped.  The left ventricle was emptied via the aortic root vent.   Cardiac arrest then was achieved with combination of cold antegrade blood cardioplegia and topical ice saline. 1.5 L of cardioplegia was administered.  There was a rapid diastolic arrest and septal cooling to 11 degrees Celsius.  A reversed saphenous vein graft was placed sequentially to the posterior descending and posterolateral branches of the right coronary.  These were both small, fair quality target vessels.  The vein was good quality.  A side-to-side anastomosis was performed to the posterior descending and an end-to-side to the posterolateral. Both were done with running 7-0 Prolene sutures.  All anastomoses were probed proximally and distally at their completion to ensure patency.  Cardioplegia was administered down each vein graft at its completion to assess flow and hemostasis, both were satisfactory.  Additional cardioplegia was administered down the aortic root as well.  A reversed saphenous vein graft then was placed end-to-side of the first diagonal branch to the LAD.  This was a 1.5 mm good quality vessel at the site of the anastomosis.  The vein was of good quality.  It was anastomosed end-to-side with a running 7-0 Prolene suture.  There was good flow and good hemostasis with cardioplegia administration.  Next, a reversed saphenous vein graft was placed end-to-side to obtuse marginal 1, this was distal to the previously-placed stent.  It was a 2 mm good quality target vessel.  The vein was of good quality and an end- to-side anastomosis was performed with a running 7-0 Prolene suture.  A probe passed easily proximally and distally.  Cardioplegia was administered.  There was good flow and good hemostasis.  After giving additional cardioplegia down the aortic root as well as the vein grafts, the left internal mammary artery was brought through a window in the pericardium and the distal end was beveled.  It was then anastomosed end-to-side to the distal LAD.  The LAD  was a 2 mm good quality target.  The mammary was a 1.5 mm good quality conduit.  The end- to-side anastomosis was performed with a running 8-0 Prolene suture.  At completion of anastomosis, the bulldog clamp was briefly removed to inspect for hemostasis.  There was rapid septal rewarming.  The bulldog clamp was replaced.  The mammary pedicle was tacked to the epicardial surface of the heart with 6-0 Prolene sutures.  Additional cardioplegia was administered.  The vein grafts were cut to length.  The cardioplegia cannula was removed from the ascending aorta. The proximal vein graft anastomoses were performed to 4.5 mm punch aortotomies with running 6-0 Prolene sutures.  At the completion of final proximal anastomosis, the patient was placed in Trendelenburg position.  Lidocaine was administered.  The aortic root was de-aired. The bulldog clamp was again removed from the left mammary artery.  The aortic crossclamp was removed.  The total crossclamp time was 86 minutes.  The patient spontaneously resumed sinus rhythm and did not require defibrillation.  While rewarming was completed, all proximal and distal anastomoses were inspected for hemostasis.  Epicardial pacing wires were placed on the right ventricle and right atrium.  When the patient had rewarmed to a core  temperature of 37 degrees Celsius, he was weaned from cardiopulmonary bypass on the first attempt.  He was in sinus rhythm and did not require inotropic support.  Total bypass time was 119 minutes. The initial cardiac index was greater than 2 L/min/m2.  He remained hemodynamically stable throughout the post bypass period.  A test dose of protamine was administered was well tolerated.  The atrial and aortic cannulae were removed.  Remainder of protamine was administered without incident.  Chest irrigated with warm saline.  Hemostasis was achieved. The pericardium was reapproximated with interrupted 3-0 silk sutures, it came  together easily without tension.  Left pleural and mediastinal chest tubes were placed through separate subcostal incisions.  Sternum was closed with a combination of single and double heavy gauge stainless steel wires.  The pectoralis fascia, subcutaneous tissue, and skin were closed in standard fashion.  All sponge, needle, and instrument counts were correct at the end of the procedure.  The patient was taken from the operating room to the Surgical Intensive Care Unit intubated and in good condition.     Salvatore Decent Dorris Fetch, M.D.     SCH/MEDQ  D:  05/04/2015  T:  05/04/2015  Job:  [id number]

## 2015-05-04 NOTE — OR Nursing (Signed)
13:05- 40 minute call to SICU charge nurse

## 2015-05-04 NOTE — Progress Notes (Signed)
Echocardiogram Echocardiogram Transesophageal has been performed.  Dorothey Baseman 05/04/2015, 9:41 AM

## 2015-05-04 NOTE — Interval H&P Note (Signed)
History and Physical Interval Note:  05/04/2015 8:18 AM  Danny Mendoza  has presented today for surgery, with the diagnosis of CAD  The various methods of treatment have been discussed with the patient and family. After consideration of risks, benefits and other options for treatment, the patient has consented to  Procedure(s): CORONARY ARTERY BYPASS GRAFTING (CABG) (N/A) TRANSESOPHAGEAL ECHOCARDIOGRAM (TEE) (N/A) as a surgical intervention .  The patient's history has been reviewed, patient examined, no change in status, stable for surgery.  I have reviewed the patient's chart and labs.  Questions were answered to the patient's satisfaction.     Loreli Slot

## 2015-05-04 NOTE — H&P (View-Only) (Signed)
PCP is No PCP Per Patient Referring Provider is Lorre Nick, MD Cardiology- Orpah Cobb, MD Leonette Most, MD and Keith Rake MD, [place], [place]  Chief Complaint  Patient presents with  . Coronary Artery Disease    3V/STEMI .Marland KitchenMarland KitchenCATHED 03/22/15 in [place], [place]., STENT to [place], ECHO 03/22/15    HPI: [name] is a 45 year old man with a past medical history of hypertension who presented to the hospital in [place] in January with chest pain. He says this began the day before presentation with the sensation that he needed to belch, but could not. He continued to have that discomfort overnight and then the following morning it was worse. He eventually went to the emergency room and was diagnosed with an ST elevation MI. He was taken urgently to the catheterization laboratory by Dr. Leonette Most where he was found to have three-vessel disease. His circumflex was totally occluded and a bare-metal stent was placed with an excellent angiographic result. He was started on aspirin and prasugrel. He was seen in consultation by Dr. Keith Rake who recommended coronary bypass grafting at around a 6 week interval post MI. The patient lives in [place] and wish to have the remainder of his treatment done here.  After returning to [facility] he had an episode of epigastric pain on January 14. He was seen in the emergency room at [facility]. He ruled out for MI. He was observed overnight and then released the following day. He has not had any additional epigastric or chest pain since that time.  He is not close to his family but is not aware of any premature coronary disease. He has been feeling fatigued for several months prior to surgery. He has not had any orthopnea, paroxysmal nocturnal dyspnea, or peripheral edema. He does complain of some burning with urination, but denies hematuria or pyuria.   Past Medical History  Diagnosis Date  . MI (myocardial infarction) (HCC)   . Hypertension   . Dyslipidemia      Past Surgical History  Procedure Laterality Date  . Carotid stent    . Cardiac catheterization  03/22/2015    [facility]    No family history on file.  Social History Social History  Substance Use Topics  . Smoking status: Former Smoker -- 0.25 packs/day for 20 years    Types: Cigarettes    Quit date: 03/22/2015  . Smokeless tobacco: Never Used     Comment: QUIT AT TIME OF HIS MI  . Alcohol Use: No     Comment: QUIT AT TIME OF HIS MI...USUALLY ONE TO FOUR BEERS A DAY    Current Outpatient Prescriptions  Medication Sig Dispense Refill  . aspirin 81 MG chewable tablet Chew 81 mg by mouth daily.    Marland Kitchen atorvastatin (LIPITOR) 80 MG tablet Take 80 mg by mouth daily at 6 PM.    . lisinopril (PRINIVIL,ZESTRIL) 2.5 MG tablet Take 2.5 mg by mouth daily.    . metoprolol tartrate (LOPRESSOR) 25 MG tablet Take 25 mg by mouth 2 (two) times daily.    . nitroGLYCERIN (NITROSTAT) 0.4 MG SL tablet Place 0.4 mg under the tongue every 5 (five) minutes as needed for chest pain.    . pantoprazole (PROTONIX) 40 MG tablet Take 1 tablet (40 mg total) by mouth at bedtime. 30 tablet 3  . prasugrel (EFFIENT) 10 MG TABS tablet Take 10 mg by mouth daily.     No current facility-administered medications for this visit.    No Known Allergies  Review of Systems  Constitutional: Positive for activity change and fatigue. Negative for fever, chills and appetite change.  HENT: Positive for dental problem. Negative for trouble swallowing.   Eyes: Negative for visual disturbance.  Respiratory: Positive for shortness of breath. Negative for cough and wheezing.   Cardiovascular: Positive for chest pain. Negative for palpitations and leg swelling.  Gastrointestinal: Positive for abdominal pain. Negative for blood in stool and abdominal distention.  Endocrine: Negative for cold intolerance and heat intolerance.  Genitourinary: Positive for dysuria. Negative for hematuria.  Musculoskeletal:  Negative for myalgias and arthralgias.  Neurological: Negative for dizziness and weakness.  Hematological: Negative for adenopathy. Does not bruise/bleed easily.  Psychiatric/Behavioral:       Feels stressed and anxious    BP 139/95 mmHg  Pulse 74  Resp 16  Ht  (1.753 m)  Wt 190 lb (86.183 kg)  BMI 28.05 kg/m2  SpO2 98% Physical Exam  Constitutional: He is oriented to person, place, and time. He appears well-developed and well-nourished. No distress.  HENT:  Head: Normocephalic and atraumatic.  Mouth/Throat: No oropharyngeal exudate.  Eyes: Conjunctivae and EOM are normal. Pupils are equal, round, and reactive to light. No scleral icterus.  Neck: Normal range of motion. Neck supple. No thyromegaly present.  Cardiovascular: Normal rate, regular rhythm, normal heart sounds and intact distal pulses.  Exam reveals no gallop and no friction rub.   No murmur heard. Allen's test left- ~ 3 sec to refill  Pulmonary/Chest: Effort normal and breath sounds normal. No respiratory distress. He has no wheezes. He has no rales.  Abdominal: Soft. Bowel sounds are normal. He exhibits no distension. There is no tenderness.  Musculoskeletal: He exhibits no edema or tenderness.  Lymphadenopathy:    He has no cervical adenopathy.  Neurological: He is alert and oriented to person, place, and time. No cranial nerve deficit. He exhibits normal muscle tone. Coordination normal.  Motor 5/5 bilaterally  Skin: Skin is warm and dry.  Psychiatric: He has a normal mood and affect.  Vitals reviewed.    Diagnostic Tests: I personally reviewed the cardiac catheterization films and echocardiogram done in [place].   He has three-vessel disease. He had a totally occluded circumflex which was stented with a good result. He appears to have some narrowing in the proximal circumflex. There is a complex lesion in the LAD at the takeoff of the second diagonal and a large septal perforating branch is  approximately 85% stenosis. He has about 70% distal right coronary stenosis with an 85% stenosis in a PL branch as well.  Echocardiogram showed EF of approximately 50%. There was no significant valvular pathology.  Impression: 45 year old man who presented in early January in [place] with an acute coronary syndrome. He had a totally occluded left circumflex which was stented acutely with a bare metal stent. His catheterization showed three-vessel coronary disease and he has significant stenoses in both the right and LAD distributions that are not amenable to percutaneous intervention. He would benefit from coronary bypass grafting for survival benefit.  I discussed the general nature of the procedure, the need for general anesthesia, the use of cardio pulmonary bypass, and the incisions to be used with Mr. and Mrs Yeatts. I reviewed the expected hospital stay, overall recovery and short and long term outcomes. He understands this is a palliative operation not a curative one. I reviewed the indications, risks, benefits, and alternatives. They understand the risks include, but are not limited to death, stroke, MI, DVT/PE, bleeding,  possible need for transfusion, infections, cardiac arrhythmias, phrenic nerve dysfunction, and other organ system dysfunction including respiratory, renal, or GI complications.   He accepts the risk and agrees to proceed.  He is young, but his anatomy is not completely favorable for multiple arterial grafting. His second best target is the second diagonal branch of the LAD so it would be suitable to use a right coronary to that. His Allen's test was acceptable but a little slow on refill. Given that the anatomy is not typically favorable I think we would plan to do a vein graft to the right coronary system. He does have some narrowing in the proximal circumflex. I'll plan to Place a vein graft to that vessel time of CABG.   Plan: Coronary bypass grafting on  05/04/2015.  Stop prasugrel after dose on 04/27/2015.  I spent 60 minutes with Mr. and Mrs. Portis during this visit, greater than 50% spent in counseling  Loreli Slot, MD [facility] [phone]

## 2015-05-04 NOTE — Brief Op Note (Addendum)
05/04/2015  12:23 PM  PATIENT:  Danny Mendoza  45 y.o. male  PRE-OPERATIVE DIAGNOSIS:  CAD  POST-OPERATIVE DIAGNOSIS:  CAD  PROCEDURE:  TRANSESOPHAGEAL ECHOCARDIOGRAM (TEE),  MEDIAN STERNOTOMY for CORONARY ARTERY BYPASS GRAFTING (CABG) x  5  LIMA to LAD  SVG to DIAGONAL  SVG to OM1  SVG SEQUENTIALLY to PDA and PLB ENDOSCOPIC VEIN HARVEST RIGHT LEG  SURGEON:  Surgeon(s) and Role:    * Loreli Slot, MD - Primary  PHYSICIAN ASSISTANT: Doree Fudge PA-C  ANESTHESIA:   general  EBL:  Total I/O In: 1700 [I.V.:1700] Out: 575 [Urine:575]  DRAINS: Chest tubes placed in the mediastinal and pleural spaces   COUNTS CORRECT:  YES   PLAN OF CARE: Admit to inpatient   PATIENT DISPOSITION:  ICU - intubated and hemodynamically stable.   Delay start of Pharmacological VTE agent (>24hrs) due to surgical blood loss or risk of bleeding: yes  BASELINE WEIGHT: 91 kg  FINDINGS Good conduits LAD, DIAG, OM good targets. PD and PL fair targets  XC= 86 min CPB= 119 min

## 2015-05-04 NOTE — OR Nursing (Signed)
13:35 - 20 minute call to SICU nurse

## 2015-05-04 NOTE — Transfer of Care (Signed)
Immediate Anesthesia Transfer of Care Note  Patient: Danny Mendoza  Procedure(s) Performed: Procedure(s): CORONARY ARTERY BYPASS GRAFTING (CABG) x  five,  using left internal mammary artery and right leg greater saphenous vein harvested endoscopically (N/A) TRANSESOPHAGEAL ECHOCARDIOGRAM (TEE) (N/A)  Patient Location: SICU  Anesthesia Type:General  Level of Consciousness: sedated and Patient remains intubated per anesthesia plan  Airway & Oxygen Therapy: Patient remains intubated per anesthesia plan and Patient placed on Ventilator (see vital sign flow sheet for setting)  Post-op Assessment: Report given to RN and Post -op Vital signs reviewed and stable  Post vital signs: Reviewed and stable  Last Vitals:  Filed Vitals:   05/04/15 0637  BP: 157/100  Pulse: 81  Temp: 36.3 C  Resp: 20    Complications: No apparent anesthesia complications

## 2015-05-05 ENCOUNTER — Inpatient Hospital Stay (HOSPITAL_COMMUNITY): Payer: Commercial Managed Care - HMO

## 2015-05-05 ENCOUNTER — Encounter (HOSPITAL_COMMUNITY): Payer: Self-pay | Admitting: Thoracic Surgery (Cardiothoracic Vascular Surgery)

## 2015-05-05 LAB — GLUCOSE, CAPILLARY
GLUCOSE-CAPILLARY: 95 mg/dL (ref 65–99)
GLUCOSE-CAPILLARY: 141 mg/dL — AB (ref 65–99)
Glucose-Capillary: 129 mg/dL — ABNORMAL HIGH (ref 65–99)
Glucose-Capillary: 136 mg/dL — ABNORMAL HIGH (ref 65–99)
Glucose-Capillary: 145 mg/dL — ABNORMAL HIGH (ref 65–99)
Glucose-Capillary: 146 mg/dL — ABNORMAL HIGH (ref 65–99)
Glucose-Capillary: 156 mg/dL — ABNORMAL HIGH (ref 65–99)

## 2015-05-05 LAB — POCT I-STAT, CHEM 8
BUN: 23 mg/dL — ABNORMAL HIGH (ref 6–20)
Calcium, Ion: 1.1 mmol/L — ABNORMAL LOW (ref 1.12–1.23)
Chloride: 102 mmol/L (ref 101–111)
Creatinine, Ser: 0.9 mg/dL (ref 0.61–1.24)
Glucose, Bld: 145 mg/dL — ABNORMAL HIGH (ref 65–99)
HEMATOCRIT: 32 % — AB (ref 39.0–52.0)
HEMOGLOBIN: 10.9 g/dL — AB (ref 13.0–17.0)
POTASSIUM: 4.1 mmol/L (ref 3.5–5.1)
SODIUM: 141 mmol/L (ref 135–145)
TCO2: 25 mmol/L (ref 0–100)

## 2015-05-05 LAB — CBC
HCT: 31.4 % — ABNORMAL LOW (ref 39.0–52.0)
HCT: 30.9 % — ABNORMAL LOW (ref 39.0–52.0)
Hemoglobin: 10.3 g/dL — ABNORMAL LOW (ref 13.0–17.0)
Hemoglobin: 10.2 g/dL — ABNORMAL LOW (ref 13.0–17.0)
MCH: 30.7 pg (ref 26.0–34.0)
MCH: 30.7 pg (ref 26.0–34.0)
MCHC: 32.8 g/dL (ref 30.0–36.0)
MCHC: 33 g/dL (ref 30.0–36.0)
MCV: 93.5 fL (ref 78.0–100.0)
MCV: 93.1 fL (ref 78.0–100.0)
PLATELETS: 173 10*3/uL (ref 150–400)
PLATELETS: 146 10*3/uL — AB (ref 150–400)
RBC: 3.36 MIL/uL — ABNORMAL LOW (ref 4.22–5.81)
RBC: 3.32 MIL/uL — ABNORMAL LOW (ref 4.22–5.81)
RDW: 12.5 % (ref 11.5–15.5)
RDW: 12.7 % (ref 11.5–15.5)
WBC: 12 10*3/uL — AB (ref 4.0–10.5)
WBC: 11.6 10*3/uL — ABNORMAL HIGH (ref 4.0–10.5)

## 2015-05-05 LAB — BASIC METABOLIC PANEL
ANION GAP: 15 (ref 5–15)
BUN: 15 mg/dL (ref 6–20)
CALCIUM: 8 mg/dL — AB (ref 8.9–10.3)
CO2: 23 mmol/L (ref 22–32)
Chloride: 106 mmol/L (ref 101–111)
Creatinine, Ser: 1.18 mg/dL (ref 0.61–1.24)
Glucose, Bld: 161 mg/dL — ABNORMAL HIGH (ref 65–99)
Potassium: 4.3 mmol/L (ref 3.5–5.1)
SODIUM: 144 mmol/L (ref 135–145)

## 2015-05-05 LAB — CREATININE, SERUM: Creatinine, Ser: 1.02 mg/dL (ref 0.61–1.24)

## 2015-05-05 LAB — MAGNESIUM
MAGNESIUM: 2.4 mg/dL (ref 1.7–2.4)
MAGNESIUM: 2.5 mg/dL — AB (ref 1.7–2.4)

## 2015-05-05 MED ORDER — KETOROLAC TROMETHAMINE 30 MG/ML IJ SOLN
30.0000 mg | Freq: Four times a day (QID) | INTRAMUSCULAR | Status: AC
  Administered 2015-05-05 – 2015-05-06 (×8): 30 mg via INTRAVENOUS
  Filled 2015-05-05 (×9): qty 1

## 2015-05-05 MED ORDER — METOPROLOL TARTRATE 25 MG PO TABS
25.0000 mg | ORAL_TABLET | Freq: Two times a day (BID) | ORAL | Status: DC
  Administered 2015-05-05 – 2015-05-06 (×4): 25 mg via ORAL
  Filled 2015-05-05 (×3): qty 1

## 2015-05-05 MED ORDER — INSULIN ASPART 100 UNIT/ML ~~LOC~~ SOLN
0.0000 [IU] | SUBCUTANEOUS | Status: DC
Start: 2015-05-05 — End: 2015-05-06
  Administered 2015-05-05 – 2015-05-06 (×3): 2 [IU] via SUBCUTANEOUS

## 2015-05-05 MED ORDER — METOPROLOL TARTRATE 25 MG/10 ML ORAL SUSPENSION: 25.0000 mg | Freq: Two times a day (BID) | ORAL | Status: DC

## 2015-05-05 MED ORDER — METOCLOPRAMIDE HCL 5 MG/ML IJ SOLN
10.0000 mg | Freq: Four times a day (QID) | INTRAMUSCULAR | Status: AC
  Administered 2015-05-05 – 2015-05-06 (×4): 10 mg via INTRAVENOUS
  Filled 2015-05-05 (×5): qty 2

## 2015-05-05 MED ORDER — ENOXAPARIN SODIUM 40 MG/0.4ML ~~LOC~~ SOLN
40.0000 mg | Freq: Every day | SUBCUTANEOUS | Status: DC
  Administered 2015-05-05 – 2015-05-07 (×3): 40 mg via SUBCUTANEOUS
  Filled 2015-05-05 (×3): qty 0.4

## 2015-05-05 MED FILL — Dexmedetomidine HCl in NaCl 0.9% IV Soln 400 MCG/100ML: INTRAVENOUS | Qty: 100 | Status: AC

## 2015-05-05 NOTE — Progress Notes (Signed)
Patient ID: Danny Mendoza, male   DOB: 12/16/1970, 45 y.o.   MRN: [id number]  SICU Evening Rounds:   Hemodynamically stable   Walked 150 ft today  Urine output good    CBC    Component Value Date/Time   WBC 11.6* 05/05/2015 1500   RBC 3.32* 05/05/2015 1500   HGB 10.9* 05/05/2015 1521   HCT 32.0* 05/05/2015 1521   PLT 146* 05/05/2015 1500   MCV 93.1 05/05/2015 1500   MCH 30.7 05/05/2015 1500   MCHC 33.0 05/05/2015 1500   RDW 12.7 05/05/2015 1500     BMET    Component Value Date/Time   NA 141 05/05/2015 1521   K 4.1 05/05/2015 1521   CL 102 05/05/2015 1521   CO2 23 05/05/2015 0400   GLUCOSE 145* 05/05/2015 1521   BUN 23* 05/05/2015 1521   CREATININE 0.90 05/05/2015 1521   CALCIUM 8.0* 05/05/2015 0400   GFRNONAA >60 05/05/2015 1500   GFRAA >60 05/05/2015 1500     A/P:  Stable postop course. Continue current plans

## 2015-05-05 NOTE — Progress Notes (Signed)
1 Day Post-Op Procedure(s) (LRB): CORONARY ARTERY BYPASS GRAFTING (CABG) x  five,  using left internal mammary artery and right leg greater saphenous vein harvested endoscopically (N/A) TRANSESOPHAGEAL ECHOCARDIOGRAM (TEE) (N/A) Subjective: C/o incisional pain  Objective: Vital signs in last 24 hours: Temp:  [95.7 F (35.4 C)-100.2 F (37.9 C)] 99 F (37.2 C) (02/23 0800) Pulse Rate:  [83-121] 112 (02/23 0800) Cardiac Rhythm:  [-] Normal sinus rhythm (02/23 0600) Resp:  [12-34] 18 (02/23 0800) BP: (74-136)/(62-102) 108/78 mmHg (02/23 0800) SpO2:  [91 %-100 %] 93 % (02/23 0800) Arterial Line BP: (83-153)/(54-90) 118/65 mmHg (02/23 0800) FiO2 (%):  [40 %-50 %] 40 % (02/22 1830) Weight:  [206 lb 8 oz (93.668 kg)] 206 lb 8 oz (93.668 kg) (02/23 0330)  Hemodynamic parameters for last 24 hours: PAP: (28-58)/(16-31) 35/21 mmHg CO:  [3.7 L/min-7.7 L/min] 5.7 L/min CI:  [1.8 L/min/m2-3.7 L/min/m2] 2.8 L/min/m2  Intake/Output from previous day: 02/22 0701 - 02/23 0700 In: 6846.8 [P.O.:150; I.V.:4166.8; Blood:400; NG/GT:30; IV Piggyback:2100] Out: 4565 [Urine:3945; Emesis/NG output:50; Chest Tube:570] Intake/Output this shift: Total I/O In: 57.6 [I.V.:57.6] Out: -   General appearance: alert, cooperative and mild distress Neurologic: intact Heart: regular rate and rhythm and + rub Lungs: diminished breath sounds bibasilar Abdomen: mildly distended, nontender  Lab Results:  Recent Labs  05/04/15 2030 05/05/15 0400  WBC 12.7* 12.0*  HGB 11.9* 10.3*  HCT 35.2* 31.4*  PLT 175 173   BMET:  Recent Labs  05/02/15 0947  05/04/15 2023 05/04/15 2030 05/05/15 0400  NA 140  < > 142  --  144  K 4.1  < > 4.5  --  4.3  CL 106  < > 105  --  106  CO2 21*  --   --   --  23  GLUCOSE 103*  < > 131*  --  161*  BUN 14  < > 12  --  15  CREATININE 0.82  < > 0.80 0.97 1.18  CALCIUM 9.6  --   --   --  8.0*  < > = values in this interval not displayed.  PT/INR:  Recent Labs   05/04/15 1425  LABPROT 17.3*  INR 1.40   ABG    Component Value Date/Time   PHART 7.305* 05/04/2015 2019   HCO3 25.0* 05/04/2015 2019   TCO2 25 05/04/2015 2023   ACIDBASEDEF 1.0 05/04/2015 2019   O2SAT 97.0 05/04/2015 2019   CBG (last 3)   Recent Labs  05/05/15 0041 05/05/15 0406  GLUCAP 145* 146*    Assessment/Plan: S/P Procedure(s) (LRB): CORONARY ARTERY BYPASS GRAFTING (CABG) x  five,  using left internal mammary artery and right leg greater saphenous vein harvested endoscopically (N/A) TRANSESOPHAGEAL ECHOCARDIOGRAM (TEE) (N/A) -  CV- POD # 1 CABG x 5. Good index- dc swan  ASA, beta blocker, statin, resume ACE-I prior to dc  + rub, nonspecific ST changes on ECG- likely pericarditis  RESP- IS for atelectasis  RENAL- creatinine normal, weight only up 5 pounds- will hold off on diuresis this AM  ENDO_ CBG low overnight, received D50, now slightly elevated- SSI  Anemia secondary to ABL- mild, follow  DC CT  OOB, ambulate   LOS: 1 day    Loreli Slot 05/05/2015

## 2015-05-05 NOTE — Anesthesia Postprocedure Evaluation (Signed)
Anesthesia Post Note  Patient: Danny Mendoza  Procedure(s) Performed: Procedure(s) (LRB): CORONARY ARTERY BYPASS GRAFTING (CABG) x  five,  using left internal mammary artery and right leg greater saphenous vein harvested endoscopically (N/A) TRANSESOPHAGEAL ECHOCARDIOGRAM (TEE) (N/A)  Patient location during evaluation: SICU Anesthesia Type: General Level of consciousness: awake and alert, oriented and patient cooperative Pain management: pain level controlled Vital Signs Assessment: post-procedure vital signs reviewed and stable Respiratory status: spontaneous breathing, nonlabored ventilation, respiratory function stable and patient connected to nasal cannula oxygen Cardiovascular status: blood pressure returned to baseline and stable : nausea improved. Anesthetic complications: no    Last Vitals:  Filed Vitals:   05/05/15 1800 05/05/15 1825  BP:  129/92  Pulse: 100 92  Temp:    Resp: 20 22    Last Pain:  Filed Vitals:   05/05/15 1831  PainSc: 6                  Rheba Diamond,E. Chrystel Barefield

## 2015-05-06 ENCOUNTER — Inpatient Hospital Stay (HOSPITAL_COMMUNITY): Payer: Commercial Managed Care - HMO

## 2015-05-06 LAB — GLUCOSE, CAPILLARY
GLUCOSE-CAPILLARY: 107 mg/dL — AB (ref 65–99)
GLUCOSE-CAPILLARY: 107 mg/dL — AB (ref 65–99)
GLUCOSE-CAPILLARY: 109 mg/dL — AB (ref 65–99)
GLUCOSE-CAPILLARY: 110 mg/dL — AB (ref 65–99)
GLUCOSE-CAPILLARY: 128 mg/dL — AB (ref 65–99)
Glucose-Capillary: 88 mg/dL (ref 65–99)
Glucose-Capillary: 129 mg/dL — ABNORMAL HIGH (ref 65–99)
Glucose-Capillary: 113 mg/dL — ABNORMAL HIGH (ref 65–99)
Glucose-Capillary: 126 mg/dL — ABNORMAL HIGH (ref 65–99)

## 2015-05-06 LAB — BASIC METABOLIC PANEL
ANION GAP: 10 (ref 5–15)
BUN: 23 mg/dL — AB (ref 6–20)
CALCIUM: 8.6 mg/dL — AB (ref 8.9–10.3)
CO2: 27 mmol/L (ref 22–32)
CREATININE: 0.9 mg/dL (ref 0.61–1.24)
Chloride: 103 mmol/L (ref 101–111)
GFR calc Af Amer: >60 mL/min (ref >60)
GLUCOSE: 124 mg/dL — AB (ref 65–99)
Potassium: 4.1 mmol/L (ref 3.5–5.1)
Sodium: 140 mmol/L (ref 135–145)

## 2015-05-06 LAB — CBC
HCT: 29.2 % — ABNORMAL LOW (ref 39.0–52.0)
Hemoglobin: 9.9 g/dL — ABNORMAL LOW (ref 13.0–17.0)
MCH: 32.2 pg (ref 26.0–34.0)
MCHC: 33.9 g/dL (ref 30.0–36.0)
MCV: 95.1 fL (ref 78.0–100.0)
PLATELETS: 136 10*3/uL — AB (ref 150–400)
RBC: 3.07 MIL/uL — ABNORMAL LOW (ref 4.22–5.81)
RDW: 12.9 % (ref 11.5–15.5)
WBC: 10.6 10*3/uL — ABNORMAL HIGH (ref 4.0–10.5)

## 2015-05-06 MED ORDER — INSULIN ASPART 100 UNIT/ML ~~LOC~~ SOLN
0.0000 [IU] | Freq: Three times a day (TID) | SUBCUTANEOUS | Status: DC
  Administered 2015-05-06: 2 [IU] via SUBCUTANEOUS

## 2015-05-06 MED ORDER — INFLUENZA VAC SPLIT QUAD 0.5 ML IM SUSY
0.5000 mL | PREFILLED_SYRINGE | INTRAMUSCULAR | Status: DC
  Filled 2015-05-06: qty 0.5

## 2015-05-06 MED ORDER — SODIUM CHLORIDE 0.9 % IV SOLN: 250.0000 mL | INTRAVENOUS | Status: DC | PRN

## 2015-05-06 MED ORDER — SODIUM CHLORIDE 0.9% FLUSH: 3.0000 mL | INTRAVENOUS | Status: DC | PRN

## 2015-05-06 MED ORDER — SODIUM CHLORIDE 0.9% FLUSH
3.0000 mL | Freq: Two times a day (BID) | INTRAVENOUS | Status: DC
  Administered 2015-05-06 – 2015-05-09 (×6): 3 mL via INTRAVENOUS

## 2015-05-06 MED ORDER — LISINOPRIL 5 MG PO TABS
5.0000 mg | ORAL_TABLET | Freq: Every day | ORAL | Status: DC
  Administered 2015-05-07: 5 mg via ORAL
  Filled 2015-05-06: qty 1

## 2015-05-06 MED ORDER — ALPRAZOLAM 0.25 MG PO TABS
0.2500 mg | ORAL_TABLET | Freq: Four times a day (QID) | ORAL | Status: DC | PRN
  Administered 2015-05-07 – 2015-05-08 (×3): 0.25 mg via ORAL
  Filled 2015-05-06 (×4): qty 1

## 2015-05-06 MED ORDER — MAGNESIUM HYDROXIDE 400 MG/5ML PO SUSP
30.0000 mL | Freq: Every day | ORAL | Status: DC | PRN
  Administered 2015-05-08: 30 mL via ORAL
  Filled 2015-05-06: qty 30

## 2015-05-06 MED ORDER — ZOLPIDEM TARTRATE 5 MG PO TABS
10.0000 mg | ORAL_TABLET | Freq: Every evening | ORAL | Status: DC | PRN
  Administered 2015-05-07 – 2015-05-08 (×2): 10 mg via ORAL
  Filled 2015-05-06 (×2): qty 2

## 2015-05-06 MED ORDER — ALUM & MAG HYDROXIDE-SIMETH 200-200-20 MG/5ML PO SUSP
15.0000 mL | ORAL | Status: DC | PRN
Start: 2015-05-06 — End: 2015-05-09

## 2015-05-06 MED ORDER — PRASUGREL HCL 10 MG PO TABS
10.0000 mg | ORAL_TABLET | Freq: Every day | ORAL | Status: DC
  Administered 2015-05-06 – 2015-05-09 (×4): 10 mg via ORAL
  Filled 2015-05-06 (×4): qty 1

## 2015-05-06 MED ORDER — GUAIFENESIN-DM 100-10 MG/5ML PO SYRP: 15.0000 mL | ORAL_SOLUTION | ORAL | Status: DC | PRN

## 2015-05-06 MED ORDER — LISINOPRIL 2.5 MG PO TABS
2.5000 mg | ORAL_TABLET | Freq: Every day | ORAL | Status: DC
  Administered 2015-05-06: 2.5 mg via ORAL
  Filled 2015-05-06: qty 1

## 2015-05-06 MED ORDER — MOVING RIGHT ALONG BOOK
Freq: Once | Status: DC
  Filled 2015-05-06: qty 1

## 2015-05-06 NOTE — Discharge Instructions (Signed)
° ° °Activity: 1.May walk up steps °               2.No lifting more than ten pounds for four weeks.  °               3.No driving for four weeks. °               4.Stop any activity that causes chest pain, shortness of breath, dizziness, sweating or excessive weakness. °               5.Avoid straining. °               6.Continue with your breathing exercises daily. ° °Diet: Diabetic diet and Low fat, Low salt diet ° °Wound Care: May shower.  Clean wounds with mild soap and water daily. Contact the office at [phone] if any problems arise. ° °Coronary Artery Bypass Grafting, Care After °Refer to this sheet in the next few weeks. These instructions provide you with information on caring for yourself after your procedure. Your health care provider may also give you more specific instructions. Your treatment has been planned according to current medical practices, but problems sometimes occur. Call your health care provider if you have any problems or questions after your procedure. °WHAT TO EXPECT AFTER THE PROCEDURE °Recovery from surgery will be different for everyone. Some people feel well after 3 or 4 weeks, while for others it takes longer. After your procedure, it is typical to have the following: °· Nausea and a lack of appetite.   °· Constipation. °· Weakness and fatigue.   °· Depression or irritability.   °· Pain or discomfort at your incision site. °HOME CARE INSTRUCTIONS °· Take medicines only as directed by your health care provider. Do not stop taking medicines or start any new medicines without first checking with your health care provider. °· Take your pulse as directed by your health care provider. °· Perform deep breathing as directed by your health care provider. If you were given a device called an incentive spirometer, use it to practice deep breathing several times a day. Support your chest with a pillow or your arms when you take deep breaths or cough. °· Keep incision areas clean, dry, and  protected. Remove or change any bandages (dressings) only as directed by your health care provider. You may have skin adhesive strips over the incision areas. Do not take the strips off. They will fall off on their own. °· Check incision areas daily for any swelling, redness, or drainage. °· If incisions were made in your legs, do the following: °¨ Avoid crossing your legs.   °¨ Avoid sitting for long periods of time. Change positions every 30 minutes.   °¨ Elevate your legs when you are sitting. °· Wear compression stockings as directed by your health care provider. These stockings help keep blood clots from forming in your legs. °· Take showers once your health care provider approves. Until then, only take sponge baths. Pat incisions dry. Do not rub incisions with a washcloth or towel. Do not take baths, swim, or use a hot tub until your health care provider approves. °· Eat foods that are high in fiber, such as raw fruits and vegetables, whole grains, beans, and nuts. Meats should be lean cut. Avoid canned, processed, and fried foods. °· Drink enough fluid to keep your urine clear or pale yellow. °· Weigh yourself every day. This helps identify if you are retaining fluid that may make your heart   and lungs work harder. °· Rest and limit activity as directed by your health care provider. You may be instructed to: °¨ Stop any activity at once if you have chest pain, shortness of breath, irregular heartbeats, or dizziness. Get help right away if you have any of these symptoms. °¨ Move around frequently for short periods or take short walks as directed by your health care provider. Increase your activities gradually. You may need physical therapy or cardiac rehabilitation to help strengthen your muscles and build your endurance. °¨ Avoid lifting, pushing, or pulling anything heavier than 10 lb (4.5 kg) for at least 6 weeks after surgery. °· Do not drive until your health care provider approves.  °· Ask your health  care provider when you may return to work. °· Ask your health care provider when you may resume sexual activity. °· Keep all follow-up visits as directed by your health care provider. This is important. °SEEK MEDICAL CARE IF: °· You have swelling, redness, increasing pain, or drainage at the site of an incision. °· You have a fever. °· You have swelling in your ankles or legs. °· You have pain in your legs.   °· You gain 2 or more pounds (0.9 kg) a day. °· You are nauseous or vomit. °· You have diarrhea.  °SEEK IMMEDIATE MEDICAL CARE IF: °· You have chest pain that goes to your jaw or arms. °· You have shortness of breath.   °· You have a fast or irregular heartbeat.   °· You notice a "clicking" in your breastbone (sternum) when you move.   °· You have numbness or weakness in your arms or legs. °· You feel dizzy or light-headed.   °MAKE SURE YOU: °· Understand these instructions. °· Will watch your condition. °· Will get help right away if you are not doing well or get worse. °  °This information is not intended to replace advice given to you by your health care provider. Make sure you discuss any questions you have with your health care provider. °  °Document Released: 09/15/2004 Document Revised: 03/19/2014 Document Reviewed: 08/05/2012 °Elsevier Interactive Patient Education ©2016 [employer] ° °

## 2015-05-06 NOTE — Discharge Summary (Signed)
Physician Discharge Summary       [address] [place]       [place] [address]             [phone]    Patient ID: Cristan Scherzer MRN: [id number] DOB/AGE: 1970/08/10 45 y.o.  Admit date: 05/04/2015 Discharge date: 05/09/2015  Admission Diagnoses: 1. S/p STEMI  2. CAD  Active Diagnoses:  1.Hypertension 2. Dyslipidemia 3. Tobacco abuse 4. ABL anemia  Procedure (s):  Median sternotomy, extracorporeal circulation, coronary artery bypass grafting x5 (left internal mammary artery to left anterior descending coronary artery, saphenous vein graft to 2nd diagonal, saphenous vein graft to obtuse marginal 1, sequential saphenous vein graft to posterior descending and posterior lateral) endoscopic vein harvest, right leg by Dr. Dorris Fetch on 05/04/2015.  History of Presenting Illness: This is a 45 year old man with a past medical history of hypertension who presented to the hospital in [place] in January with chest pain. He says this began the day before presentation with the sensation that he needed to belch, but could not. He continued to have that discomfort overnight and then the following morning it was worse. He eventually went to the emergency room and was diagnosed with an ST elevation MI. He was taken urgently to the catheterization laboratory by Dr. Leonette Most where he was found to have three-vessel disease. His circumflex was totally occluded and a bare-metal stent was placed with an excellent angiographic result. He was started on aspirin and prasugrel. He was seen in consultation by Dr. Keith Rake who recommended coronary bypass grafting at around a 6 week interval post MI. The patient lives in [place] and wish to have the remainder of his treatment done here.  After returning to [facility], he had an episode of epigastric pain on January 14. He was seen in the emergency room at [facility]. He ruled out for MI. He was observed overnight and then released the  following day. He has not had any additional epigastric or chest pain since that time.  He is not close to his family but is not aware of any premature coronary disease. He has been feeling fatigued for several months prior to surgery. He has not had any orthopnea, paroxysmal nocturnal dyspnea, or peripheral edema. He does complain of some burning with urination, but denies hematuria or pyuria.  Dr. Dorris Fetch discussed the need for coronary artery bypass grafting surgery. Potential risks, complications, and benefits were discussed with the patient and he agreed to proceed with surgery. Pre operative carotid duplex showed no significant bilateral internal carotid artery stenosis. He underwent CABG x 5 on 05/04/2015.  Brief Hospital Course:  The patient was extubated the evening of surgery without difficulty. He remained afebrile and hemodynamically stable. He had a rub post op as well as nonspecific ST changes on EKG, which were likely related to pericarditis. Theone Murdoch, a line, chest tubes, and foley were removed early in the post operative course. Lopressor was started and titrated accordingly. He was volume over loaded and diuresed. Hehad ABL anemia. He  did not require a post op transfusion.  His last H and H was up to 8.2 and 25.9. He did have one positive occult blood stool. He had no evidence of significant GI bleeding. It may be from NGT, gastritis, etc. He was weaned off the insulin drip. . The patient's HGA1C pre op was 5.8.  The patient was felt surgically stable for transfer from the ICU to PCTU for further convalescence on 05/07/2015. He continues to  progress with cardiac rehab. He was ambulating on room air. He has been tolerating a diet and has had a bowel movement. He has been hypertensive so Lisinopril was titrated to a dose of 40 mg daily. Epicardial pacing wires were removed on 05/08/2015. Chest tube sutures will be removed prior to discharge. Per Dr. Dorris Fetch, the patient is felt  surgically stable for discharge today.   Latest Vital Signs: Blood pressure 153/104, pulse 90, temperature 98 F (36.7 C), temperature source Oral, resp. rate 18, weight 199 lb 3.2 oz (90.357 kg), SpO2 100 %.  Physical Exam: General appearance: alert, cooperative and no distress Neurologic: intact Heart: regular rate and rhythm Lungs: diminished breath sounds right base Abdomen: normal findings: soft, non-tender  Discharge Condition:Stable for discharge home  Recent laboratory studies:  Lab Results  Component Value Date   WBC 9.0 05/09/2015   HGB 8.2* 05/09/2015   HCT 25.9* 05/09/2015   MCV 95.9 05/09/2015   PLT 258 05/09/2015   Lab Results  Component Value Date   NA 140 05/09/2015   K 3.9 05/09/2015   CL 105 05/09/2015   CO2 25 05/09/2015   CREATININE 0.91 05/09/2015   GLUCOSE 103* 05/09/2015   Diagnostic Studies:  Dg Chest Port 1 View  05/06/2015  CLINICAL DATA:  Status post CABG 2 days ago EXAM: PORTABLE CHEST 1 VIEW COMPARISON:  Portable chest x-ray of May 05, 2015 FINDINGS: The right lung remains hypoinflated. There is right basilar subsegmental atelectasis. There is small right pleural effusion. The left lung is better inflated. There is no significant effusion. There is minimal lower lobe subsegmental atelectasis. The cardiac silhouette remains enlarged. The pulmonary vascularity is slightly less engorged. The sternal wires are intact. There has been interval removal of the Swan-Ganz catheter and the right internal jugular Cordis sheath as well as the left chest tube and mediastinal drain. IMPRESSION: Persistent bibasilar atelectasis. No significant pleural effusion and no pneumothorax. Mild cardiomegaly with central pulmonary vascular congestion, improved. Electronically Signed   By: David  Swaziland M.D.   On: 05/06/2015 07:39      Discharge Instructions    Amb Referral to Cardiac Rehabilitation    Complete by:  As directed   Diagnosis:  CABG           Discharge Medications:   Medication List    STOP taking these medications        nitroGLYCERIN 0.4 MG SL tablet  Commonly known as:  NITROSTAT      TAKE these medications        aspirin 81 MG chewable tablet  Chew 81 mg by mouth daily.     atorvastatin 80 MG tablet  Commonly known as:  LIPITOR  Take 80 mg by mouth daily at 6 PM.     furosemide 40 MG tablet  Commonly known as:  LASIX  Take 1 tablet (40 mg total) by mouth daily. For 4 days then stop     lisinopril 40 MG tablet  Commonly known as:  PRINIVIL,ZESTRIL  Take 1 tablet (40 mg total) by mouth daily.     metoprolol 50 MG tablet  Commonly known as:  LOPRESSOR  Take 1 tablet (50 mg total) by mouth 2 (two) times daily.     oxyCODONE 5 MG immediate release tablet  Commonly known as:  Oxy IR/ROXICODONE  Take 1-2 tablets (5-10 mg total) by mouth every 4 (four) hours as needed for severe pain.     pantoprazole 40 MG tablet  Commonly known as:  PROTONIX  Take 1 tablet (40 mg total) by mouth at bedtime.     potassium chloride SA 20 MEQ tablet  Commonly known as:  K-DUR,KLOR-CON  Take 1 tablet (20 mEq total) by mouth daily. For 4 days then stop.     prasugrel 10 MG Tabs tablet  Commonly known as:  EFFIENT  Take 10 mg by mouth daily. Reported on 04/27/2015       The patient has been discharged on:   1.Beta Blocker:  Yes [ x  ]                              No   [   ]                              If No, reason:  2.Ace Inhibitor/ARB: Yes [ x  ]                                     No  [    ]                                     If No, reason:  3.Statin:   Yes [ x  ]                  No  [   ]                  If No, reason:  4.Ecasa:  Yes  [ x  ]                  No   [   ]                  If No, reason:  Follow Up Appointments: Follow-up Information    Follow up with [facility], MD.   Specialty:  Cardiology   Why:  Please call for a 2 week follow up appointment   Contact information:   [address] [place] [address] [phone]       Follow up with Loreli Slot, MD On 06/07/2015.   Specialty:  Cardiothoracic Surgery   Why:  PA/LAT CXR to be taken (at [facility] which is in the same building as Dr. Sunday Corn office) on 06/07/2015 at 11:00 am;Appointment time is at 11:45 am   Contact information:   [address] [place] [address] [phone]       Follow up with Medical Doctor.   Why:  Obtain a primary care physician for further surveillance of HGA1C 5.8 (pre diabetes)      Signed: ZIMMERMAN,DONIELLE MPA-C 05/09/2015, 2:51 PM

## 2015-05-06 NOTE — Care Management Note (Signed)
Case Management Note  Patient Details  Name: Danny Mendoza MRN: [id number] Date of Birth: [birth year]  Subjective/Objective:    Pt lives with spouse who will provide 24/7 assistance when he is medically ready for discharge.                            Expected Discharge Plan:  Home/Self Care  Discharge planning Services  CM Consult  Status of Service:  In process, will continue to follow  Magdalene River, RN 05/06/2015, 1:40 PM

## 2015-05-06 NOTE — Progress Notes (Signed)
2 Days Post-Op Procedure(s) (LRB): CORONARY ARTERY BYPASS GRAFTING (CABG) x  five,  using left internal mammary artery and right leg greater saphenous vein harvested endoscopically (N/A) TRANSESOPHAGEAL ECHOCARDIOGRAM (TEE) (N/A) Subjective: No complaints this AM. Feels well. Pain much better  Objective: Vital signs in last 24 hours: Temp:  [98 F (36.7 C)-99.1 F (37.3 C)] 98 F (36.7 C) (02/24 0727) Pulse Rate:  [88-118] 112 (02/24 0700) Cardiac Rhythm:  [-] Normal sinus rhythm (02/24 0400) Resp:  [11-29] 26 (02/24 0700) BP: (108-137)/(72-100) 137/100 mmHg (02/24 0700) SpO2:  [87 %-100 %] 87 % (02/24 0700) Arterial Line BP: (107-160)/(51-74) 160/51 mmHg (02/23 1200) Weight:  [204 lb 2.3 oz (92.6 kg)] 204 lb 2.3 oz (92.6 kg) (02/24 0700)  Hemodynamic parameters for last 24 hours: PAP: (35)/(21) 35/21 mmHg CO:  [8.2 L/min] 8.2 L/min CI:  [4 L/min/m2] 4 L/min/m2  Intake/Output from previous day: 02/23 0701 - 02/24 0700 In: 942.9 [P.O.:480; I.V.:362.9; IV Piggyback:100] Out: 1155 [Urine:1055; Chest Tube:100] Intake/Output this shift:    General appearance: alert, cooperative and no distress Neurologic: intact Heart: regular rate and rhythm Lungs: diminished breath sounds right base Abdomen: normal findings: soft, non-tender  Lab Results:  Recent Labs  05/05/15 1500 05/05/15 1521 05/06/15 0337  WBC 11.6*  --  10.6*  HGB 10.2* 10.9* 9.9*  HCT 30.9* 32.0* 29.2*  PLT 146*  --  136*   BMET:  Recent Labs  05/05/15 0400  05/05/15 1521 05/06/15 0337  NA 144  --  141 140  K 4.3  --  4.1 4.1  CL 106  --  102 103  CO2 23  --   --  27  GLUCOSE 161*  --  145* 124*  BUN 15  --  23* 23*  CREATININE 1.18  < > 0.90 0.90  CALCIUM 8.0*  --   --  8.6*  < > = values in this interval not displayed.  PT/INR:  Recent Labs  05/04/15 1425  LABPROT 17.3*  INR 1.40   ABG    Component Value Date/Time   PHART 7.305* 05/04/2015 2019   HCO3 25.0* 05/04/2015 2019   TCO2 25  05/05/2015 1521   ACIDBASEDEF 1.0 05/04/2015 2019   O2SAT 97.0 05/04/2015 2019   CBG (last 3)   Recent Labs  05/05/15 1934 05/06/15 0001 05/06/15 0338  GLUCAP 129* 128* 113*    Assessment/Plan: S/P Procedure(s) (LRB): CORONARY ARTERY BYPASS GRAFTING (CABG) x  five,  using left internal mammary artery and right leg greater saphenous vein harvested endoscopically (N/A) TRANSESOPHAGEAL ECHOCARDIOGRAM (TEE) (N/A) Plan for transfer to step-down: see transfer orders  POD # 2 CABG x 5  CV- stable in SR. Continue ASA, beta blocker, statin  Restart effient (circumflex stent) and lisinopril  RESP- CXR shows some right basilar atelectasis, IS  RENAL- creatinine and lytes OK  ENDO- CBG well controlled- change to AC/HS  Anemia secondary to ABL- mild, follow  Increase ambulation   LOS: 2 days    Loreli Slot 05/06/2015

## 2015-05-07 ENCOUNTER — Inpatient Hospital Stay (HOSPITAL_COMMUNITY): Payer: Commercial Managed Care - HMO

## 2015-05-07 LAB — BASIC METABOLIC PANEL
Anion gap: 8 (ref 5–15)
BUN: 22 mg/dL — ABNORMAL HIGH (ref 6–20)
CHLORIDE: 106 mmol/L (ref 101–111)
CO2: 26 mmol/L (ref 22–32)
CREATININE: 0.81 mg/dL (ref 0.61–1.24)
Calcium: 8.6 mg/dL — ABNORMAL LOW (ref 8.9–10.3)
GFR calc non Af Amer: >60 mL/min (ref >60)
Glucose, Bld: 130 mg/dL — ABNORMAL HIGH (ref 65–99)
POTASSIUM: 3.8 mmol/L (ref 3.5–5.1)
SODIUM: 140 mmol/L (ref 135–145)

## 2015-05-07 LAB — GLUCOSE, CAPILLARY
GLUCOSE-CAPILLARY: 96 mg/dL (ref 65–99)
GLUCOSE-CAPILLARY: 108 mg/dL — AB (ref 65–99)
GLUCOSE-CAPILLARY: 102 mg/dL — AB (ref 65–99)
Glucose-Capillary: 112 mg/dL — ABNORMAL HIGH (ref 65–99)

## 2015-05-07 LAB — CBC
HCT: 26.4 % — ABNORMAL LOW (ref 39.0–52.0)
HEMOGLOBIN: 8.7 g/dL — AB (ref 13.0–17.0)
MCH: 30.9 pg (ref 26.0–34.0)
MCHC: 33 g/dL (ref 30.0–36.0)
MCV: 93.6 fL (ref 78.0–100.0)
Platelets: 155 10*3/uL (ref 150–400)
RBC: 2.82 MIL/uL — AB (ref 4.22–5.81)
RDW: 12.6 % (ref 11.5–15.5)
WBC: 9.9 10*3/uL (ref 4.0–10.5)

## 2015-05-07 MED ORDER — TRAMADOL HCL 50 MG PO TABS
50.0000 mg | ORAL_TABLET | ORAL | Status: DC | PRN
  Administered 2015-05-07 – 2015-05-08 (×6): 50 mg via ORAL
  Filled 2015-05-07 (×6): qty 1

## 2015-05-07 MED ORDER — LISINOPRIL 10 MG PO TABS
10.0000 mg | ORAL_TABLET | Freq: Every day | ORAL | Status: DC
  Administered 2015-05-08: 10 mg via ORAL
  Filled 2015-05-07: qty 1

## 2015-05-07 MED ORDER — INFLUENZA VAC SPLIT QUAD 0.5 ML IM SUSY: 0.5000 mL | PREFILLED_SYRINGE | INTRAMUSCULAR | Status: DC

## 2015-05-07 MED ORDER — FUROSEMIDE 40 MG PO TABS
40.0000 mg | ORAL_TABLET | Freq: Every day | ORAL | Status: DC
  Administered 2015-05-07 – 2015-05-09 (×3): 40 mg via ORAL
  Filled 2015-05-07 (×3): qty 1

## 2015-05-07 MED ORDER — LABETALOL HCL 5 MG/ML IV SOLN
10.0000 mg | INTRAVENOUS | Status: DC | PRN
Start: 2015-05-07 — End: 2015-05-09
  Administered 2015-05-07: 10 mg via INTRAVENOUS
  Filled 2015-05-07: qty 4

## 2015-05-07 MED ORDER — METOPROLOL TARTRATE 25 MG PO TABS
25.0000 mg | ORAL_TABLET | Freq: Once | ORAL | Status: AC
  Administered 2015-05-07: 25 mg via ORAL
  Filled 2015-05-07: qty 1

## 2015-05-07 MED ORDER — ASPIRIN EC 81 MG PO TBEC
81.0000 mg | DELAYED_RELEASE_TABLET | Freq: Every day | ORAL | Status: DC
  Administered 2015-05-08 – 2015-05-09 (×2): 81 mg via ORAL
  Filled 2015-05-07 (×2): qty 1

## 2015-05-07 MED ORDER — METOPROLOL TARTRATE 25 MG/10 ML ORAL SUSPENSION: 25.0000 mg | Freq: Two times a day (BID) | ORAL | Status: DC

## 2015-05-07 MED ORDER — METOPROLOL TARTRATE 50 MG PO TABS
50.0000 mg | ORAL_TABLET | Freq: Two times a day (BID) | ORAL | Status: DC
  Administered 2015-05-07 – 2015-05-09 (×5): 50 mg via ORAL
  Filled 2015-05-07 (×5): qty 1

## 2015-05-07 MED ORDER — POTASSIUM CHLORIDE CRYS ER 20 MEQ PO TBCR
30.0000 meq | EXTENDED_RELEASE_TABLET | Freq: Every day | ORAL | Status: DC
  Administered 2015-05-07 – 2015-05-09 (×3): 30 meq via ORAL
  Filled 2015-05-07 (×3): qty 1

## 2015-05-07 NOTE — Progress Notes (Addendum)
[address]       [place] [address]             [phone]      3 Days Post-Op Procedure(s) (LRB): CORONARY ARTERY BYPASS GRAFTING (CABG) x  five,  using left internal mammary artery and right leg greater saphenous vein harvested endoscopically (N/A) TRANSESOPHAGEAL ECHOCARDIOGRAM (TEE) (N/A) Subjective: Feels fair, didn't sleep well, no specific issues  Objective: Vital signs in last 24 hours: Temp:  [97.5 F (36.4 C)-98.3 F (36.8 C)] 98.3 F (36.8 C) (02/25 0433) Pulse Rate:  [86-110] 95 (02/25 0847) Cardiac Rhythm:  [-] Normal sinus rhythm (02/25 0700) Resp:  [14-21] 18 (02/25 0433) BP: (133-168)/(90-110) 155/97 mmHg (02/25 0847) SpO2:  [86 %-98 %] 96 % (02/25 0847) Weight:  [203 lb 1.6 oz (92.126 kg)] 203 lb 1.6 oz (92.126 kg) (02/25 0433)  Hemodynamic parameters for last 24 hours:    Intake/Output from previous day: 02/24 0701 - 02/25 0700 In: 10 [I.V.:10] Out: 1065 [Urine:1065] Intake/Output this shift: Total I/O In: 3 [I.V.:3] Out: -   General appearance: alert, cooperative and no distress Heart: regular rate and rhythm Lungs: dim in lower fields Abdomen: benign Extremities: min edema Wound: incis healing well  Lab Results:  Recent Labs  05/06/15 0337 05/07/15 0358  WBC 10.6* 9.9  HGB 9.9* 8.7*  HCT 29.2* 26.4*  PLT 136* 155   BMET:  Recent Labs  05/06/15 0337 05/07/15 0358  NA 140 140  K 4.1 3.8  CL 103 106  CO2 27 26  GLUCOSE 124* 130*  BUN 23* 22*  CREATININE 0.90 0.81  CALCIUM 8.6* 8.6*    PT/INR:  Recent Labs  05/04/15 1425  LABPROT 17.3*  INR 1.40   ABG    Component Value Date/Time   PHART 7.305* 05/04/2015 2019   HCO3 25.0* 05/04/2015 2019   TCO2 25 05/05/2015 1521   ACIDBASEDEF 1.0 05/04/2015 2019   O2SAT 97.0 05/04/2015 2019   CBG (last 3)   Recent Labs  05/06/15 1834 05/06/15 2116 05/07/15 0637  GLUCAP 126* 107* 96    Meds Scheduled Meds: . acetaminophen  1,000 mg Oral 4  times per day   Or  . acetaminophen (TYLENOL) oral liquid 160 mg/5 mL  1,000 mg Per Tube 4 times per day  . aspirin EC  325 mg Oral Daily   Or  . aspirin  324 mg Per Tube Daily  . atorvastatin  80 mg Oral q1800  . bisacodyl  10 mg Oral Daily   Or  . bisacodyl  10 mg Rectal Daily  . docusate sodium  200 mg Oral Daily  . enoxaparin (LOVENOX) injection  40 mg Subcutaneous QHS  . Influenza vac split quadrivalent PF  0.5 mL Intramuscular Tomorrow-1000  . insulin aspart  0-15 Units Subcutaneous TID WC  . lisinopril  5 mg Oral Daily  . metoprolol tartrate  50 mg Oral BID   Or  . metoprolol tartrate  25 mg Per Tube BID  . moving right along book   Does not apply Once  . pantoprazole  40 mg Oral QHS  . prasugrel  10 mg Oral Daily  . sodium chloride flush  3 mL Intravenous Q12H   Continuous Infusions:  PRN Meds:.sodium chloride, ALPRAZolam, alum & mag hydroxide-simeth, guaiFENesin-dextromethorphan, labetalol, magnesium hydroxide, ondansetron (ZOFRAN) IV, oxyCODONE, sodium chloride flush, zolpidem  Xrays Dg Chest 2 View  05/07/2015  CLINICAL DATA:  Atelectasis, hypertension, CABG EXAM: CHEST  2  VIEW COMPARISON:  05/06/2015 FINDINGS: Prior CABG. There is cardiomegaly. Right basilar airspace opacity again noted with an could reflect atelectasis or infiltrate. Left base atelectasis is stable. Small bilateral effusions. Vascular congestion and probable mild interstitial edema. No pneumothorax. IMPRESSION: Cardiomegaly, suspect mild interstitial edema. Bibasilar atelectasis or infiltrates, right greater than left, similar to prior study. Small effusions. Electronically Signed   By: Charlett Nose M.D.   On: 05/07/2015 09:27   Dg Chest Port 1 View  05/06/2015  CLINICAL DATA:  Status post CABG 2 days ago EXAM: PORTABLE CHEST 1 VIEW COMPARISON:  Portable chest x-ray of May 05, 2015 FINDINGS: The right lung remains hypoinflated. There is right basilar subsegmental atelectasis. There is small right  pleural effusion. The left lung is better inflated. There is no significant effusion. There is minimal lower lobe subsegmental atelectasis. The cardiac silhouette remains enlarged. The pulmonary vascularity is slightly less engorged. The sternal wires are intact. There has been interval removal of the Swan-Ganz catheter and the right internal jugular Cordis sheath as well as the left chest tube and mediastinal drain. IMPRESSION: Persistent bibasilar atelectasis. No significant pleural effusion and no pneumothorax. Mild cardiomegaly with central pulmonary vascular congestion, improved. Electronically Signed   By: David  Swaziland M.D.   On: 05/06/2015 07:39  p  Assessment/Plan: S/P Procedure(s) (LRB): CORONARY ARTERY BYPASS GRAFTING (CABG) x  five,  using left internal mammary artery and right leg greater saphenous vein harvested endoscopically (N/A) TRANSESOPHAGEAL ECHOCARDIOGRAM (TEE) (N/A)  1 steady progress  2 Elevated BP- will increase lisinopril dose 3 ABL anemia- lower H/H- observe, on asa and effient/lovenox- repeat cbc in am 4 volume overload- would benefit from some diuresis, will start lasix 5 push rehab and pulm toilet  LOS: 3 days    Danny Mendoza,Danny Mendoza 05/07/2015  I have seen and examined the patient and agree with the assessment and plan as outlined.  Patient on Effient.  Decrease ASA to 81 mg/day.  Purcell Nails, MD 05/07/2015 12:55 PM

## 2015-05-07 NOTE — Progress Notes (Signed)
05/06/2015 1650 Received a transfer to room 2W28 from 2S.  Pt is A&O, no c/o voiced at this time.  Tele monitor applied and CCMD notified.  Oriented to room, call light and bed.  Call bell in reach. Kathryne Hitch

## 2015-05-07 NOTE — Progress Notes (Signed)
Pt BP uncontrolled throughout the night 160's/100's. Pt very anxious; given PRN Xanax with no relief. Pt received additional  PO Metoprolol and  Labetolol. BP checked manually to confirm. Pt asymptomatic but stated " Im just so anxious and I couldn't sleep all night". RN will report off to oncoming nurse and continue to monitor.  Roselie Awkward, RN

## 2015-05-07 NOTE — Progress Notes (Signed)
CARDIAC REHAB PHASE I   PRE:  Rate/Rhythm: 87 SR  BP:  Supine: 129/92 Sitting:   Standing:    SaO2: 94% 2L O2  MODE:  Ambulation: 350 ft   POST:  Rate/Rhythm: 105  BP:  Supine: 132/84 Sitting:   Standing:    SaO2: 93% 2L O2  1017-1104 Patient tolerated ambulation fair with assist x1 and pushing a rolling walker. Gait slow steady, one standing rest break taken, c/o SOB during walk. To bed after walk. Discussed CABG d/c education including sternal precautions, signs/symptoms of infection, IS use, heart healthy diet and activity progression. Pt sleepy during education needs reinforcement. Heart healthy diet handout and exercise guidelines given. Discussed Phase 2 cardiac rehab, and pt is interested in the program at [facility], referral sent. Artist Pais, MS, ACSM CCEP

## 2015-05-07 NOTE — Progress Notes (Signed)
RN paged Dr. Cornelius Moras  Related to pt BP 160/100. MD returned page. New orders given for  Metoprolol PO, PRN Labetalol  , and to increase scheduled Metoprolol to  BID. RN will follow orders and continue to monitor.  Roselie Awkward, RN

## 2015-05-08 LAB — CBC
HEMATOCRIT: 23.9 % — AB (ref 39.0–52.0)
HEMOGLOBIN: 7.9 g/dL — AB (ref 13.0–17.0)
MCH: 31.5 pg (ref 26.0–34.0)
MCHC: 33.1 g/dL (ref 30.0–36.0)
MCV: 95.2 fL (ref 78.0–100.0)
Platelets: 207 10*3/uL (ref 150–400)
RBC: 2.51 MIL/uL — ABNORMAL LOW (ref 4.22–5.81)
RDW: 12.9 % (ref 11.5–15.5)
WBC: 9.5 10*3/uL (ref 4.0–10.5)

## 2015-05-08 LAB — GLUCOSE, CAPILLARY
GLUCOSE-CAPILLARY: 92 mg/dL (ref 65–99)
GLUCOSE-CAPILLARY: 96 mg/dL (ref 65–99)
Glucose-Capillary: 131 mg/dL — ABNORMAL HIGH (ref 65–99)
Glucose-Capillary: 103 mg/dL — ABNORMAL HIGH (ref 65–99)

## 2015-05-08 LAB — OCCULT BLOOD X 1 CARD TO LAB, STOOL: FECAL OCCULT BLD: POSITIVE — AB

## 2015-05-08 MED ORDER — KETOROLAC TROMETHAMINE 30 MG/ML IJ SOLN
INTRAMUSCULAR | Status: AC
  Administered 2015-05-08: 30 mg via INTRAVENOUS
  Filled 2015-05-08: qty 1

## 2015-05-08 MED ORDER — KETOROLAC TROMETHAMINE 30 MG/ML IJ SOLN
30.0000 mg | Freq: Once | INTRAMUSCULAR | Status: AC
  Administered 2015-05-08 (×2): 30 mg via INTRAVENOUS

## 2015-05-08 MED ORDER — LISINOPRIL 10 MG PO TABS: 20.0000 mg | ORAL_TABLET | Freq: Every day | ORAL | Status: DC

## 2015-05-08 NOTE — Progress Notes (Signed)
No arrhythmias noted over the last 24hrs.  Pacing wires pulled per MD order.  Pt tolerated well.  Right side oozing a bit, pressure held for 60-90seconds, oozing slowed. Gauze dressing applied.

## 2015-05-08 NOTE — Progress Notes (Addendum)
[address]       [place] [address]             [phone]      4 Days Post-Op Procedure(s) (LRB): CORONARY ARTERY BYPASS GRAFTING (CABG) x  five,  using left internal mammary artery and right leg greater saphenous vein harvested endoscopically (N/A) TRANSESOPHAGEAL ECHOCARDIOGRAM (TEE) (N/A) Subjective: Feels pretty well  Objective: Vital signs in last 24 hours: Temp:  [97.7 F (36.5 C)-98.2 F (36.8 C)] 98.2 F (36.8 C) (02/26 0530) Pulse Rate:  [95-100] 95 (02/26 0530) Cardiac Rhythm:  [-] Normal sinus rhythm (02/26 0714) Resp:  [18-19] 18 (02/26 0530) BP: (122-180)/(91-112) 122/91 mmHg (02/26 0530) SpO2:  [93 %-98 %] 95 % (02/26 0530) Weight:  [200 lb 8 oz (90.946 kg)] 200 lb 8 oz (90.946 kg) (02/26 0530)  Hemodynamic parameters for last 24 hours:    Intake/Output from previous day: 02/25 0701 - 02/26 0700 In: 243 [P.O.:240; I.V.:3] Out: 1000 [Urine:1000] Intake/Output this shift:    General appearance: alert, cooperative and no distress Heart: regular rate and rhythm Lungs: mildly dim in bases Abdomen: benign Extremities: min edema Wound: incis healing well  Lab Results:  Recent Labs  05/07/15 0358 05/08/15 0250  WBC 9.9 9.5  HGB 8.7* 7.9*  HCT 26.4* 23.9*  PLT 155 207   BMET:  Recent Labs  05/06/15 0337 05/07/15 0358  NA 140 140  K 4.1 3.8  CL 103 106  CO2 27 26  GLUCOSE 124* 130*  BUN 23* 22*  CREATININE 0.90 0.81  CALCIUM 8.6* 8.6*    PT/INR: No results for input(s): LABPROT, INR in the last 72 hours. ABG    Component Value Date/Time   PHART 7.305* 05/04/2015 2019   HCO3 25.0* 05/04/2015 2019   TCO2 25 05/05/2015 1521   ACIDBASEDEF 1.0 05/04/2015 2019   O2SAT 97.0 05/04/2015 2019   CBG (last 3)   Recent Labs  05/07/15 1618 05/07/15 2154 05/08/15 0610  GLUCAP 102* 112* 92    Meds Scheduled Meds: . acetaminophen  1,000 mg Oral 4 times per day  . aspirin EC  81 mg Oral Daily  . atorvastatin  80  mg Oral q1800  . bisacodyl  10 mg Oral Daily   Or  . bisacodyl  10 mg Rectal Daily  . docusate sodium  200 mg Oral Daily  . enoxaparin (LOVENOX) injection  40 mg Subcutaneous QHS  . furosemide  40 mg Oral Daily  . Influenza vac split quadrivalent PF  0.5 mL Intramuscular Tomorrow-1000  . insulin aspart  0-15 Units Subcutaneous TID WC  . lisinopril  10 mg Oral Daily  . metoprolol tartrate  50 mg Oral BID  . moving right along book   Does not apply Once  . pantoprazole  40 mg Oral QHS  . potassium chloride  30 mEq Oral Daily  . prasugrel  10 mg Oral Daily  . sodium chloride flush  3 mL Intravenous Q12H   Continuous Infusions:  PRN Meds:.sodium chloride, ALPRAZolam, alum & mag hydroxide-simeth, guaiFENesin-dextromethorphan, labetalol, magnesium hydroxide, ondansetron (ZOFRAN) IV, oxyCODONE, sodium chloride flush, traMADol, zolpidem  Xrays Dg Chest 2 View  05/07/2015  CLINICAL DATA:  Atelectasis, hypertension, CABG EXAM: CHEST  2 VIEW COMPARISON:  05/06/2015 FINDINGS: Prior CABG. There is cardiomegaly. Right basilar airspace opacity again noted with an could reflect atelectasis or infiltrate. Left base atelectasis is stable. Small bilateral effusions. Vascular congestion and probable mild interstitial edema. No pneumothorax. IMPRESSION:  Cardiomegaly, suspect mild interstitial edema. Bibasilar atelectasis or infiltrates, right greater than left, similar to prior study. Small effusions. Electronically Signed   By: Charlett Nose M.D.   On: 05/07/2015 09:27    Assessment/Plan: S/P Procedure(s) (LRB): CORONARY ARTERY BYPASS GRAFTING (CABG) x  five,  using left internal mammary artery and right leg greater saphenous vein harvested endoscopically (N/A) TRANSESOPHAGEAL ECHOCARDIOGRAM (TEE) (N/A)  1 conts to do well clinically 2 will increase lisinopril for increased BP 3 his H/H cont to drop- will guiac stools as he has no obvious source of ongoing bleeding. Stop lovenox 4 hold on d/c for now   LOS: 4 days    Danny Mendoza,Danny Mendoza 05/08/2015  I have seen and examined the patient and agree with the assessment and plan as outlined.    Purcell Nails, MD 05/08/2015 10:31 AM

## 2015-05-08 NOTE — Progress Notes (Signed)
Pt fecal blood occult resulted positive, Dr. Cornelius Moras notified.  No new orders received at this time.

## 2015-05-08 NOTE — Progress Notes (Signed)
Pt ambulated in halls today 135ft x2 with walker and standby assist.

## 2015-05-09 LAB — CBC
HCT: 25.9 % — ABNORMAL LOW (ref 39.0–52.0)
Hemoglobin: 8.2 g/dL — ABNORMAL LOW (ref 13.0–17.0)
MCH: 30.4 pg (ref 26.0–34.0)
MCHC: 31.7 g/dL (ref 30.0–36.0)
MCV: 95.9 fL (ref 78.0–100.0)
PLATELETS: 258 10*3/uL (ref 150–400)
RBC: 2.7 MIL/uL — AB (ref 4.22–5.81)
RDW: 12.9 % (ref 11.5–15.5)
WBC: 9 10*3/uL (ref 4.0–10.5)

## 2015-05-09 LAB — GLUCOSE, CAPILLARY
GLUCOSE-CAPILLARY: 88 mg/dL (ref 65–99)
GLUCOSE-CAPILLARY: 93 mg/dL (ref 65–99)
GLUCOSE-CAPILLARY: 90 mg/dL (ref 65–99)

## 2015-05-09 LAB — BASIC METABOLIC PANEL
ANION GAP: 10 (ref 5–15)
BUN: 27 mg/dL — ABNORMAL HIGH (ref 6–20)
CALCIUM: 8.7 mg/dL — AB (ref 8.9–10.3)
CO2: 25 mmol/L (ref 22–32)
Chloride: 105 mmol/L (ref 101–111)
Creatinine, Ser: 0.91 mg/dL (ref 0.61–1.24)
Glucose, Bld: 103 mg/dL — ABNORMAL HIGH (ref 65–99)
POTASSIUM: 3.9 mmol/L (ref 3.5–5.1)
SODIUM: 140 mmol/L (ref 135–145)

## 2015-05-09 MED ORDER — METOPROLOL TARTRATE 50 MG PO TABS: 50.0000 mg | ORAL_TABLET | Freq: Two times a day (BID) | ORAL | Status: AC

## 2015-05-09 MED ORDER — LISINOPRIL 10 MG PO TABS
20.0000 mg | ORAL_TABLET | Freq: Two times a day (BID) | ORAL | Status: DC
Start: 2015-05-09 — End: 2015-05-09
  Administered 2015-05-09: 20 mg via ORAL
  Filled 2015-05-09: qty 2

## 2015-05-09 MED ORDER — FUROSEMIDE 40 MG PO TABS: 40.0000 mg | ORAL_TABLET | Freq: Every day | ORAL | Status: DC

## 2015-05-09 MED ORDER — POTASSIUM CHLORIDE CRYS ER 20 MEQ PO TBCR: 20.0000 meq | EXTENDED_RELEASE_TABLET | Freq: Every day | ORAL | Status: DC

## 2015-05-09 MED ORDER — OXYCODONE HCL 5 MG PO TABS: 5.0000 mg | ORAL_TABLET | ORAL | Status: DC | PRN

## 2015-05-09 MED ORDER — POTASSIUM CHLORIDE CRYS ER 20 MEQ PO TBCR
20.0000 meq | EXTENDED_RELEASE_TABLET | Freq: Once | ORAL | Status: AC
  Administered 2015-05-09: 20 meq via ORAL

## 2015-05-09 MED ORDER — LISINOPRIL 40 MG PO TABS: 40.0000 mg | ORAL_TABLET | Freq: Every day | ORAL | Status: AC

## 2015-05-09 NOTE — Progress Notes (Signed)
Pt. Discharged to home with wife Pt. D/C'd via  Wheelchair with NT Discharge information reviewed and given All personal belongings given to Pt.  Education discussed and reviewed with pt. And family IV was d/c and intact upon removal  Tele d/c

## 2015-05-09 NOTE — Progress Notes (Signed)
CARDIAC REHAB PHASE I   PRE:  Rate/Rhythm: 92 SR    BP: sitting 156/106 left, 140/100 left after relaxing some    SaO2: 99-100 RA  MODE:  Ambulation: 400 ft   POST:  Rate/Rhythm: 98 SR    BP: sitting 156/108 left, 140/96 right arm     SaO2: 98 RA  BP elevated in left arm upon arrival, decreased somewhat after a few minutes of talking with pt about his country.  Pt unsteady walking around room, steady with RW in hall. Increased distance but pace very slow. No c/o. Upon return to room I took BP in right arm to see the difference (above). BP better in right arm, notified pt of this. Inspiring 700 mL on IS. Wife present. Will need review ed with pt and wife if he is for d/c. At this point he will need RW for home. [id number]  Harriet Masson CES, ACSM 05/09/2015 9:33 AM

## 2015-05-09 NOTE — Progress Notes (Signed)
Utilization review completed.  

## 2015-05-09 NOTE — Progress Notes (Signed)
Took  Oxy down to pharmacy give to [employer]. Pt. Only wanted 5 mg of oxy. Teresa Coombs 7:05 PM

## 2015-05-09 NOTE — Progress Notes (Addendum)
[address]       [place] [address]             [phone]        5 Days Post-Op Procedure(s) (LRB): CORONARY ARTERY BYPASS GRAFTING (CABG) x  five,  using left internal mammary artery and right leg greater saphenous vein harvested endoscopically (N/A) TRANSESOPHAGEAL ECHOCARDIOGRAM (TEE) (N/A)  Subjective: Patient states wife is juicing beets for him daily. He has no specific complaints.  Objective: Vital signs in last 24 hours: Temp:  [98 F (36.7 C)-98.5 F (36.9 C)] 98.5 F (36.9 C) (02/27 0533) Pulse Rate:  [93-95] 94 (02/27 0533) Cardiac Rhythm:  [-] Normal sinus rhythm (02/26 1907) Resp:  [18] 18 (02/27 0533) BP: (125-155)/(88-102) 155/102 mmHg (02/27 0533) SpO2:  [94 %-100 %] 100 % (02/27 0533) Weight:  [199 lb 3.2 oz (90.357 kg)] 199 lb 3.2 oz (90.357 kg) (02/27 0533)  Pre op weight  91 kg Current Weight  05/09/15 199 lb 3.2 oz (90.357 kg)      Intake/Output from previous day: 02/26 0701 - 02/27 0700 In: 480 [P.O.:480] Out: 300 [Urine:300]   Physical Exam:  Cardiovascular: RRR Pulmonary: Clear to auscultation bilaterally; no rales, wheezes, or rhonchi. Abdomen: Soft, non tender, bowel sounds present. Extremities: Trace bilateral lower extremity edema. Wounds: Clean and dry.  No erythema or signs of infection.  Lab Results: CBC: Recent Labs  05/08/15 0250 05/09/15 0329  WBC 9.5 9.0  HGB 7.9* 8.2*  HCT 23.9* 25.9*  PLT 207 258   BMET:  Recent Labs  05/07/15 0358 05/09/15 0329  NA 140 140  K 3.8 3.9  CL 106 105  CO2 26 25  GLUCOSE 130* 103*  BUN 22* 27*  CREATININE 0.81 0.91  CALCIUM 8.6* 8.7*    PT/INR:  Lab Results  Component Value Date   INR 1.40 05/04/2015   INR 1.01 05/02/2015   INR 1.08 03/27/2015   ABG:  INR: Will add last result for INR, ABG once components are confirmed Will add last 4 CBG results once components are confirmed  Assessment/Plan:  1. CV - SR in the 90's and hypertensive  this am. Patient states he has "white coat hypertension" and is very anxious in any clinical situation. On Lopressor 50 mg bid, Lisinopril 20 mg daily, and Effient 10 mg daily. Will increase Lisinopril, but may need a third agent 2.  Pulmonary - On room air. Encourage incentive spirometer 3. Volume Overload - On Lasix 40 mg daily 4.  Acute blood loss anemia - H and H this am stable at 8.2 and 25.9. Fecal occult blood positive last evening. Lovenox stopped yesterday. Will discuss with Dr. Dorris Fetch as if should obtain a GI consult 5. Supplement potassium 6. DM-CBGs 96/103/88. On Insulin PRN. Pre op HGA1C 5.8. He is pre diabetic and will need further surveillance as as outpatient.     ZIMMERMAN,DONIELLE MPA-C 05/09/2015,7:24 AM  Patient seen and examined, agree with above Hgb stable. I do not know how to interpret a + occult blood in stool in the acute postop setting, as I expect it would be + in the majority of patients. Could be from NG, gastritis or any number of things. It can be repeated in the outpatient setting. He has no evidence of a significant GIB. His BP is still high. agree with increasing lisinopril. It is not high enough to justify keeping him in the hospital. It can be managed as an outpatient.  Viviann Spare  Chaya Jan, MD [facility] [phone]

## 2015-05-24 ENCOUNTER — Telehealth: Payer: Self-pay | Admitting: *Deleted

## 2015-05-24 DIAGNOSIS — G8918 Other acute postprocedural pain: Secondary | ICD-10-CM

## 2015-05-24 MED ORDER — OXYCODONE HCL 5 MG PO TABS: 5.0000 mg | ORAL_TABLET | ORAL | Status: DC | PRN

## 2015-05-24 NOTE — Telephone Encounter (Signed)
Danny Mendoza was recently discharged after a CABG. He is requesting a pan med refill. I informed him that a new signed script would be available today at the front desk at our office.  He agreed.

## 2015-06-06 ENCOUNTER — Other Ambulatory Visit: Payer: Self-pay | Admitting: Thoracic Surgery (Cardiothoracic Vascular Surgery)

## 2015-06-06 DIAGNOSIS — Z951 Presence of aortocoronary bypass graft: Secondary | ICD-10-CM

## 2015-06-07 ENCOUNTER — Ambulatory Visit
Admission: RE | Admit: 2015-06-07 | Discharge: 2015-06-07 | Disposition: A | Discharge status: Home / Self Care | Payer: Commercial Managed Care - HMO | Source: Ambulatory Visit | Attending: Thoracic Surgery (Cardiothoracic Vascular Surgery) | Admitting: Thoracic Surgery (Cardiothoracic Vascular Surgery)

## 2015-06-07 ENCOUNTER — Encounter: Payer: Self-pay | Admitting: Thoracic Surgery (Cardiothoracic Vascular Surgery)

## 2015-06-07 ENCOUNTER — Ambulatory Visit (INDEPENDENT_AMBULATORY_CARE_PROVIDER_SITE_OTHER): Payer: Self-pay | Admitting: Thoracic Surgery (Cardiothoracic Vascular Surgery)

## 2015-06-07 VITALS — BP 128/86 | HR 71 | Resp 16 | Ht 69.0 in | Wt 194.4 lb

## 2015-06-07 DIAGNOSIS — I213 ST elevation (STEMI) myocardial infarction of unspecified site: Secondary | ICD-10-CM

## 2015-06-07 DIAGNOSIS — Z951 Presence of aortocoronary bypass graft: Secondary | ICD-10-CM

## 2015-06-07 DIAGNOSIS — I2510 Atherosclerotic heart disease of native coronary artery without angina pectoris: Secondary | ICD-10-CM

## 2015-06-07 DIAGNOSIS — G8918 Other acute postprocedural pain: Secondary | ICD-10-CM

## 2015-06-07 MED ORDER — OXYCODONE HCL 5 MG PO TABS: 5.0000 mg | ORAL_TABLET | Freq: Three times a day (TID) | ORAL | Status: DC | PRN

## 2015-06-07 NOTE — Progress Notes (Signed)
[address]       [place] [address]             [phone]       HPI: Danny Mendoza day for a scheduled postoperative follow-up visit.  He is a 45 year old gentleman who had an MI in [place] back in January. He had a stent placed at that time. He had significant three-vessel coronary disease and was referred for interval coronary bypass grafting. I did coronary bypass grafting  5 on 05/04/2015. His postoperative course was uncomplicated and he went home on postoperative day #5.  He is feeling well. He is only taking the oxycodone a couple of times a day. He did request a new prescription make sure he doesn't run out. For the most part he doesn't need it. His exercise tolerance is improving. He is walking 10-15 minutes twice a day. He's not having any problems with shortness of breath. He does not have any swelling in his legs.  Past Medical History  Diagnosis Date  . MI (myocardial infarction) (HCC)   . Hypertension   . Dyslipidemia       Current Outpatient Prescriptions  Medication Sig Dispense Refill  . aspirin 81 MG chewable tablet Chew 81 mg by mouth daily.    Marland Kitchen atorvastatin (LIPITOR) 80 MG tablet Take 80 mg by mouth daily at 6 PM.    . clopidogrel (PLAVIX) 75 MG tablet Take 75 mg by mouth daily.    Marland Kitchen lisinopril (PRINIVIL,ZESTRIL) 40 MG tablet Take 1 tablet (40 mg total) by mouth daily. 30 tablet 1  . metoprolol tartrate (LOPRESSOR) 50 MG tablet Take 1 tablet (50 mg total) by mouth 2 (two) times daily. 60 tablet 1  . pantoprazole (PROTONIX) 40 MG tablet Take 1 tablet (40 mg total) by mouth at bedtime. 30 tablet 3  . oxyCODONE (OXY IR/ROXICODONE) 5 MG immediate release tablet Take 1-2 tablets (5-10 mg total) by mouth 3 (three) times daily as needed for severe pain. 30 tablet 0   No current facility-administered medications for this visit.    Physical Exam BP 128/86 mmHg  Pulse 71  Resp 16  Ht  (1.753 m)  Wt 194 lb 6.4 oz (88.179 kg)  BMI  28.69 kg/m2  SpO31 39% 45 year old man in no acute distress Alert and oriented 3 with no focal deficits Lungs clear with equal breath sounds bilaterally Cardiac regular rate and rhythm normal S1 and S2 no rubs or murmurs Sternum stable, incision healing well Leg incision healing well, no peripheral edema  Diagnostic Tests: I personally reviewed his chest x-ray shows postoperative changes. There is no effusion or infiltrate.  Impression: 45 year old man who is now about 4 weeks out from coronary bypass grafting  5 for three-vessel coronary disease. He also still has a stent in the left circumflex. He is doing extremely well at this point in time.  He has not had any recurrent angina.  His exercise tolerance is good and continues to improve.  He may begin driving. He should not drive after taking oxycodone. Appropriate precautions were discussed.  He should not lift anything over 10 pounds for another 2 weeks or anything over 20 pounds for another 4 weeks.  He can go back to work at [employer] duty in 4 weeks (2 months postop)\  He has not yet seen Dr. Algie Coffer postoperatively. We will arrange for follow-up appointment.  Plan: Follow-up with Dr. Algie Coffer  I will be happy to see him back  any time in the future if I can be of any further assistance with his care.  Loreli SlotSteven C Rajni Holsworth, MD [facility] [phone]

## 2015-06-30 ENCOUNTER — Other Ambulatory Visit: Payer: Self-pay | Admitting: Physician Assistant

## 2016-01-03 ENCOUNTER — Ambulatory Visit (INDEPENDENT_AMBULATORY_CARE_PROVIDER_SITE_OTHER): Payer: Commercial Managed Care - HMO | Admitting: Physician Assistant

## 2016-01-03 DIAGNOSIS — M542 Cervicalgia: Secondary | ICD-10-CM

## 2016-01-03 DIAGNOSIS — M545 Low back pain, unspecified: Secondary | ICD-10-CM

## 2016-01-03 MED ORDER — TRAMADOL HCL 50 MG PO TABS: 25.0000 mg | ORAL_TABLET | Freq: Four times a day (QID) | ORAL | 0 refills | Status: DC | PRN

## 2016-01-03 MED ORDER — CYCLOBENZAPRINE HCL 10 MG PO TABS: 5.0000 mg | ORAL_TABLET | Freq: Three times a day (TID) | ORAL | 0 refills | Status: DC | PRN
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
## 2016-01-03 NOTE — Patient Instructions (Signed)
     IF you received an x-ray today, you will receive an invoice from [facility]. Please contact [facility] at [phone] with questions or concerns regarding your invoice.   IF you received labwork today, you will receive an invoice from [facility]. Please contact [facility] at [phone] with questions or concerns regarding your invoice.   Our billing staff will not be able to assist you with questions regarding bills from these companies.  You will be contacted with the lab results as soon as they are available. The fastest way to get your results is to activate your My Chart account. Instructions are located on the last page of this paperwork. If you have not heard from us regarding the results in 2 weeks, please contact this office.      

## 2016-01-09 ENCOUNTER — Ambulatory Visit (HOSPITAL_COMMUNITY)
Admission: RE | Admit: 2016-01-09 | Discharge: 2016-01-09 | Disposition: A | Discharge status: Home / Self Care | Payer: Commercial Managed Care - HMO | Source: Ambulatory Visit | Attending: Chiropractic Medicine | Admitting: Chiropractic Medicine

## 2016-01-09 ENCOUNTER — Other Ambulatory Visit: Payer: Self-pay | Admitting: Chiropractic Medicine

## 2016-01-09 DIAGNOSIS — M542 Cervicalgia: Secondary | ICD-10-CM | POA: Insufficient documentation

## 2016-05-07 DIAGNOSIS — E784 Other hyperlipidemia: Secondary | ICD-10-CM | POA: Diagnosis not present

## 2016-05-07 DIAGNOSIS — I2510 Atherosclerotic heart disease of native coronary artery without angina pectoris: Secondary | ICD-10-CM | POA: Diagnosis not present

## 2016-05-07 DIAGNOSIS — Z9861 Coronary angioplasty status: Secondary | ICD-10-CM | POA: Diagnosis not present

## 2016-09-30 ENCOUNTER — Emergency Department (HOSPITAL_COMMUNITY)
Admission: EM | Admit: 2016-09-30 | Discharge: 2016-09-30 | Disposition: A | Discharge status: Home / Self Care | Payer: Commercial Managed Care - HMO | Attending: Emergency Medicine | Admitting: Emergency Medicine

## 2016-09-30 ENCOUNTER — Encounter (HOSPITAL_COMMUNITY): Payer: Self-pay | Admitting: Emergency Medicine

## 2016-09-30 ENCOUNTER — Emergency Department (HOSPITAL_COMMUNITY): Payer: Commercial Managed Care - HMO

## 2016-09-30 DIAGNOSIS — K5900 Constipation, unspecified: Secondary | ICD-10-CM | POA: Diagnosis not present

## 2016-09-30 DIAGNOSIS — R109 Unspecified abdominal pain: Secondary | ICD-10-CM | POA: Diagnosis not present

## 2016-09-30 DIAGNOSIS — Z79899 Other long term (current) drug therapy: Secondary | ICD-10-CM | POA: Diagnosis not present

## 2016-09-30 DIAGNOSIS — Z7982 Long term (current) use of aspirin: Secondary | ICD-10-CM | POA: Diagnosis not present

## 2016-09-30 DIAGNOSIS — I252 Old myocardial infarction: Secondary | ICD-10-CM | POA: Diagnosis not present

## 2016-09-30 DIAGNOSIS — Z951 Presence of aortocoronary bypass graft: Secondary | ICD-10-CM | POA: Diagnosis not present

## 2016-09-30 DIAGNOSIS — K8500 Idiopathic acute pancreatitis without necrosis or infection: Secondary | ICD-10-CM | POA: Insufficient documentation

## 2016-09-30 DIAGNOSIS — Z7902 Long term (current) use of antithrombotics/antiplatelets: Secondary | ICD-10-CM | POA: Diagnosis not present

## 2016-09-30 DIAGNOSIS — Z87891 Personal history of nicotine dependence: Secondary | ICD-10-CM | POA: Diagnosis not present

## 2016-09-30 DIAGNOSIS — I10 Essential (primary) hypertension: Secondary | ICD-10-CM | POA: Diagnosis not present

## 2016-09-30 LAB — CBC
HEMATOCRIT: 49.2 % (ref 39.0–52.0)
HEMOGLOBIN: 17.4 g/dL — AB (ref 13.0–17.0)
MCH: 32.3 pg (ref 26.0–34.0)
MCHC: 35.4 g/dL (ref 30.0–36.0)
MCV: 91.3 fL (ref 78.0–100.0)
Platelets: 308 10*3/uL (ref 150–400)
RBC: 5.39 MIL/uL (ref 4.22–5.81)
RDW: 12.1 % (ref 11.5–15.5)
WBC: 11.1 10*3/uL — AB (ref 4.0–10.5)

## 2016-09-30 LAB — URINALYSIS, ROUTINE W REFLEX MICROSCOPIC
BACTERIA UA: NONE SEEN
BILIRUBIN URINE: NEGATIVE
Glucose, UA: NEGATIVE mg/dL
HGB URINE DIPSTICK: NEGATIVE
KETONES UR: NEGATIVE mg/dL
LEUKOCYTES UA: NEGATIVE
NITRITE: NEGATIVE
PROTEIN: 100 mg/dL — AB
SQUAMOUS EPITHELIAL / LPF: NONE SEEN
Specific Gravity, Urine: 1.017 (ref 1.005–1.030)
pH: 6 (ref 5.0–8.0)

## 2016-09-30 LAB — COMPREHENSIVE METABOLIC PANEL
ALBUMIN: 4.5 g/dL (ref 3.5–5.0)
ALT: 19 U/L (ref 17–63)
ANION GAP: 11 (ref 5–15)
AST: 18 U/L (ref 15–41)
Alkaline Phosphatase: 55 U/L (ref 38–126)
BUN: 15 mg/dL (ref 6–20)
CHLORIDE: 98 mmol/L — AB (ref 101–111)
CO2: 27 mmol/L (ref 22–32)
Calcium: 9.7 mg/dL (ref 8.9–10.3)
Creatinine, Ser: 0.87 mg/dL (ref 0.61–1.24)
GFR calc Af Amer: >60 mL/min (ref >60)
GFR calc non Af Amer: >60 mL/min (ref >60)
GLUCOSE: 112 mg/dL — AB (ref 65–99)
Potassium: 3.8 mmol/L (ref 3.5–5.1)
Sodium: 136 mmol/L (ref 135–145)
TOTAL PROTEIN: 8.7 g/dL — AB (ref 6.5–8.1)
Total Bilirubin: 1.5 mg/dL — ABNORMAL HIGH (ref 0.3–1.2)

## 2016-09-30 LAB — LIPASE, BLOOD: LIPASE: 178 U/L — AB (ref 11–51)

## 2016-09-30 MED ORDER — SODIUM CHLORIDE 0.9 % IV BOLUS (SEPSIS)
1000.0000 mL | Freq: Once | INTRAVENOUS | Status: AC
  Administered 2016-09-30: 1000 mL via INTRAVENOUS

## 2016-09-30 MED ORDER — ONDANSETRON 4 MG PO TBDP: 4.0000 mg | ORAL_TABLET | Freq: Three times a day (TID) | ORAL | 0 refills | Status: AC | PRN

## 2016-09-30 MED ORDER — IOPAMIDOL (ISOVUE-300) INJECTION 61%
INTRAVENOUS | Status: AC
  Administered 2016-09-30: 100 mL via INTRAVENOUS
  Filled 2016-09-30: qty 100

## 2016-09-30 MED ORDER — ONDANSETRON HCL 4 MG/2ML IJ SOLN
4.0000 mg | Freq: Once | INTRAMUSCULAR | Status: AC
  Administered 2016-09-30: 4 mg via INTRAVENOUS
  Filled 2016-09-30: qty 2

## 2016-09-30 MED ORDER — OXYCODONE-ACETAMINOPHEN 5-325 MG PO TABS: 1.0000 | ORAL_TABLET | Freq: Four times a day (QID) | ORAL | 0 refills | Status: DC | PRN

## 2016-09-30 MED ORDER — MORPHINE SULFATE (PF) 4 MG/ML IV SOLN
4.0000 mg | Freq: Once | INTRAVENOUS | Status: AC
  Administered 2016-09-30: 4 mg via INTRAVENOUS
  Filled 2016-09-30: qty 1

## 2016-09-30 NOTE — ED Triage Notes (Signed)
"  I havent eaten in 3 days." Complaining of pain when he does eat. Pt went to urgent care and sent here for possible ileus. Pt just had first BM in the waiting room here for first time in 3 days. Pt took laxative yesterday. Denies N/V

## 2016-09-30 NOTE — ED Provider Notes (Signed)
[facility]-EMERGENCY DEPT Provider Note   CSN: [id number] Arrival date & time: 09/30/16  1651     History   Chief Complaint Chief Complaint  Patient presents with  . Abdominal Pain    HPI Danny Mendoza is a 46 y.o. male.  HPI  Pt presenting with c/o abdominal pain.  Pt states that pain is in mid abdomen.  Began 2 days ago.  Pain is worse after eating.  No vomiting, has had decreased stools.  Last BM in the ED today.  No fever/chills.  He states he has had some episodes of abdominal pain similar to this in the past but they have gone away on their own.  No dysuria.  He was seen at urgent care and advised to come to the ED due to "question mild ileus" read on xray.  There are no other associated systemic symptoms, there are no other alleviating or modifying factors.   Past Medical History:  Diagnosis Date  . Dyslipidemia   . Hypertension   . MI (myocardial infarction) [facility])     Patient Active Problem List   Diagnosis Date Noted  . CAD (coronary artery disease) 05/04/2015  . 3-vessel CAD 04/19/2015    Past Surgical History:  Procedure Laterality Date  . CARDIAC CATHETERIZATION  03/22/2015   [facility]  . CORONARY ANGIOPLASTY     [facility] CX 03/22/15  . CORONARY ARTERY BYPASS GRAFT N/A 05/04/2015   Procedure: CORONARY ARTERY BYPASS GRAFTING (CABG) x  five,  using left internal mammary artery and right leg greater saphenous vein harvested endoscopically;  Surgeon: Loreli Slot, MD;  Location: [facility] OR;  Service: Open Heart Surgery;  Laterality: N/A;  . TEE WITHOUT CARDIOVERSION N/A 05/04/2015   Procedure: TRANSESOPHAGEAL ECHOCARDIOGRAM (TEE);  Surgeon: Loreli Slot, MD;  Location: [facility] OR;  Service: Open Heart Surgery;  Laterality: N/A;       Home Medications    Prior to Admission medications   Medication Sig Start Date End Date Taking? Authorizing Provider  aspirin 81 MG chewable tablet Chew 81 mg by mouth daily.    [provider]    atorvastatin (LIPITOR) 80 MG tablet Take 80 mg by mouth daily at 6 PM.    [provider]  clopidogrel (PLAVIX) 75 MG tablet Take 75 mg by mouth daily.    [provider]  cyclobenzaprine (FLEXERIL) 10 MG tablet Take 0.5-1 tablets (5-10 mg total) by mouth 3 (three) times daily as needed for muscle spasms (May cause drowsiness. Do no operate heavy machinery while taking.). 01/03/16   Ofilia Neas, PA-C  lisinopril (PRINIVIL,ZESTRIL) 40 MG tablet Take 1 tablet (40 mg total) by mouth daily. 05/09/15   Ardelle Balls, PA-C  metoprolol tartrate (LOPRESSOR) 50 MG tablet Take 1 tablet (50 mg total) by mouth 2 (two) times daily. 05/09/15   Ardelle Balls, PA-C  ondansetron (ZOFRAN ODT) 4 MG disintegrating tablet Take 1 tablet (4 mg total) by mouth every 8 (eight) hours as needed. 09/30/16   Jerelyn Scott, MD  oxyCODONE-acetaminophen (PERCOCET/ROXICET) 5-325 MG tablet Take 1-2 tablets by mouth every 6 (six) hours as needed for severe pain. 09/30/16   Jerelyn Scott, MD  pantoprazole (PROTONIX) 40 MG tablet Take 1 tablet (40 mg total) by mouth at bedtime. 03/27/15   Orpah Cobb, MD  traMADol (ULTRAM) 50 MG tablet Take 0.5-1 tablets (25-50 mg total) by mouth every 6 (six) hours as needed for severe pain. 01/03/16   Ofilia Neas, PA-C  Family History History reviewed. No pertinent family history.  Social History Social History  Substance Use Topics  . Smoking status: Former Smoker    Packs/day: 0.25    Years: 20.00    Types: Cigarettes    Quit date: 03/22/2015  . Smokeless tobacco: Never Used     Comment: QUIT AT TIME OF HIS MI  . Alcohol use 0.0 oz/week     Comment: occ     Allergies   Patient has no known allergies.   Review of Systems Review of Systems  ROS reviewed and all otherwise negative except for mentioned in HPI   Physical Exam Updated Vital Signs BP 121/83   Pulse 73   Temp 97.9 F (36.6 C) (Oral)   Resp 14   SpO2 97%  Vitals  reviewed Physical Exam Physical Examination: General appearance - alert, well appearing, and in no distress Mental status - alert, oriented to person, place, and time Eyes - no conjunctival injection, no scleral icterus Chest - clear to auscultation, no wheezes, rales or rhonchi, symmetric air entry Heart - normal rate, regular rhythm, normal S1, S2, no murmurs, rubs, clicks or gallops Abdomen - soft, ttp in mid epigastric and mid abdominal area, no gaurding or rebound tenderness, nabs, nondistended, no masses or organomegaly Neurological - alert, oriented, normal speech Extremities - peripheral pulses normal, no pedal edema, no clubbing or cyanosis Skin - normal coloration and turgor, no rashes  ED Treatments / Results  Labs (all labs ordered are listed, but only abnormal results are displayed) Labs Reviewed  LIPASE, BLOOD - Abnormal; Notable for the following:       Result Value   Lipase 178 (*)    All other components within normal limits  COMPREHENSIVE METABOLIC PANEL - Abnormal; Notable for the following:    Chloride 98 (*)    Glucose, Bld 112 (*)    Total Protein 8.7 (*)    Total Bilirubin 1.5 (*)    All other components within normal limits  CBC - Abnormal; Notable for the following:    WBC 11.1 (*)    Hemoglobin 17.4 (*)    All other components within normal limits  URINALYSIS, ROUTINE W REFLEX MICROSCOPIC - Abnormal; Notable for the following:    Protein, ur 100 (*)    All other components within normal limits    EKG  EKG Interpretation None       Radiology Ct Abdomen Pelvis W Contrast  Result Date: 09/30/2016 CLINICAL DATA:  Periumbilical pain for 3 days. EXAM: CT ABDOMEN AND PELVIS WITH CONTRAST TECHNIQUE: Multidetector CT imaging of the abdomen and pelvis was performed using the standard protocol following bolus administration of intravenous contrast. CONTRAST:  ISOVUE-300 IOPAMIDOL (ISOVUE-300) INJECTION 61% COMPARISON:  None. FINDINGS: Lower chest: No  acute abnormality. Hepatobiliary: Focal fatty deposition is seen adjacent to the falciform ligament. The portal vein is patent. The gallbladder is normal. No other abnormalities. Pancreas: Mild peripancreatic fat stranding, particularly adjacent to the tail of the pancreas. No evidence of pancreatic hemorrhage or necrosis. No peripancreatic fluid collections. Spleen: Normal in size without focal abnormality. Adrenals/Urinary Tract: Adrenal glands are unremarkable. Kidneys are normal, without renal calculi, focal lesion, or hydronephrosis. Bladder is unremarkable. Stomach/Bowel: The stomach and small bowel are normal. Colonic diverticulosis is seen without diverticulitis. The colon is otherwise normal. The appendix is unremarkable. Vascular/Lymphatic: Mild atherosclerosis is seen in the right common iliac artery. Vessels are otherwise normal. No adenopathy. Reproductive: Prostate is unremarkable. Other: No free air or  free fluid. There is a tiny periumbilical hernia. Musculoskeletal: No acute or significant osseous findings. IMPRESSION: 1. Fat stranding adjacent to the pancreas, particularly the tail, is consistent with pancreatitis. This correlates with the elevated lipase. Electronically Signed   By: Gerome Samavid  Williams III M.D   On: 09/30/2016 19:50    Procedures Procedures (including critical care time)  Medications Ordered in ED Medications  morphine 4 MG/ML injection 4 mg (4 mg Intravenous Given 09/30/16 1829)  ondansetron (ZOFRAN) injection 4 mg (4 mg Intravenous Given 09/30/16 1829)  sodium chloride 0.9 % bolus 1,000 mL (0 mLs Intravenous Stopped 09/30/16 2019)  iopamidol (ISOVUE-300) 61 % injection (100 mLs Intravenous Contrast Given 09/30/16 1918)     Initial Impression / Assessment and Plan / ED Course  I have reviewed the triage vital signs and the nursing notes.  Pertinent labs & imaging results that were available during my care of the patient were reviewed by me and considered in my medical  decision making (see chart for details).     Pt presenting with c/o abdominal pain, lipase is somewhat elevated, CT scan is c/w pancreatitis.  Pt treated for pain, able to tolerate po fluids in the ED without difficulty.  Discharged with strict return precautions.  Pt agreeable with plan.  Final Clinical Impressions(s) / ED Diagnoses   Final diagnoses:  Idiopathic acute pancreatitis without infection or necrosis    New Prescriptions New Prescriptions   ONDANSETRON (ZOFRAN ODT) 4 MG DISINTEGRATING TABLET    Take 1 tablet (4 mg total) by mouth every 8 (eight) hours as needed.   OXYCODONE-ACETAMINOPHEN (PERCOCET/ROXICET) 5-325 MG TABLET    Take 1-2 tablets by mouth every 6 (six) hours as needed for severe pain.     Jerelyn ScottLinker, Shamela Haydon, MD 09/30/16 2106

## 2016-09-30 NOTE — Discharge Instructions (Signed)
Return to the ED with any concerns including fever/chills, vomiting and not able to keep down liquids, worsening abdominal pain, decreased level of alertness/lethargy, or any other alarming symptoms   You should drink only clear liquids for the next 48 hours to let the pancreas rest, then gradually increase your diet to bland foods slowly

## 2016-09-30 NOTE — ED Notes (Signed)
Patient transported to CT 

## 2016-09-30 NOTE — ED Notes (Signed)
Pt departed in NAD, refused use of wheelchair.  

## 2016-12-02 IMAGING — CR DG CHEST 2V
2 series · 2 of 2 positions shown · non-contrast
Comparison: None.

CLINICAL DATA: Preoperative evaluation for coronary artery bypass
graft

EXAM:
CHEST  2 VIEW

[w chest pa]
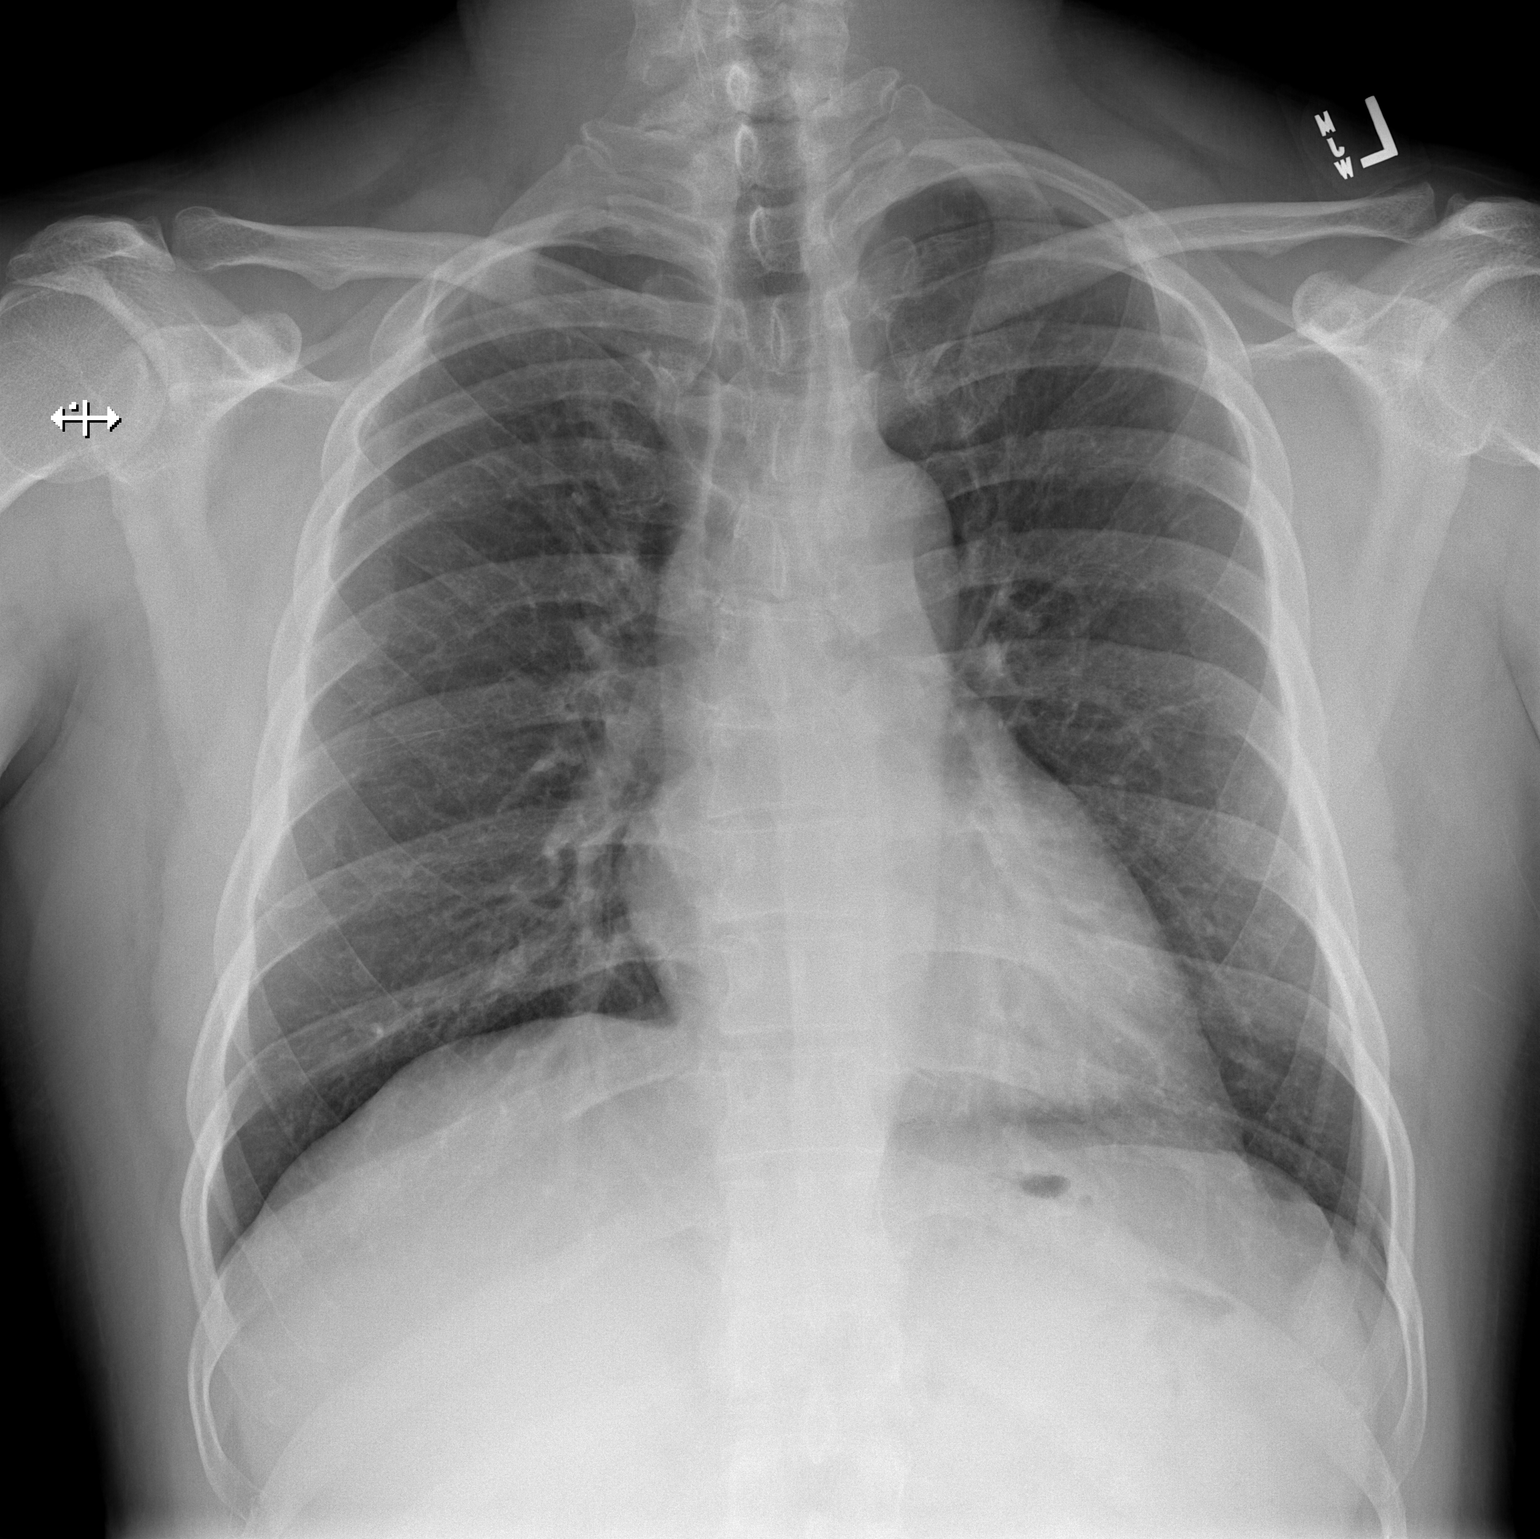

[w chest lat]
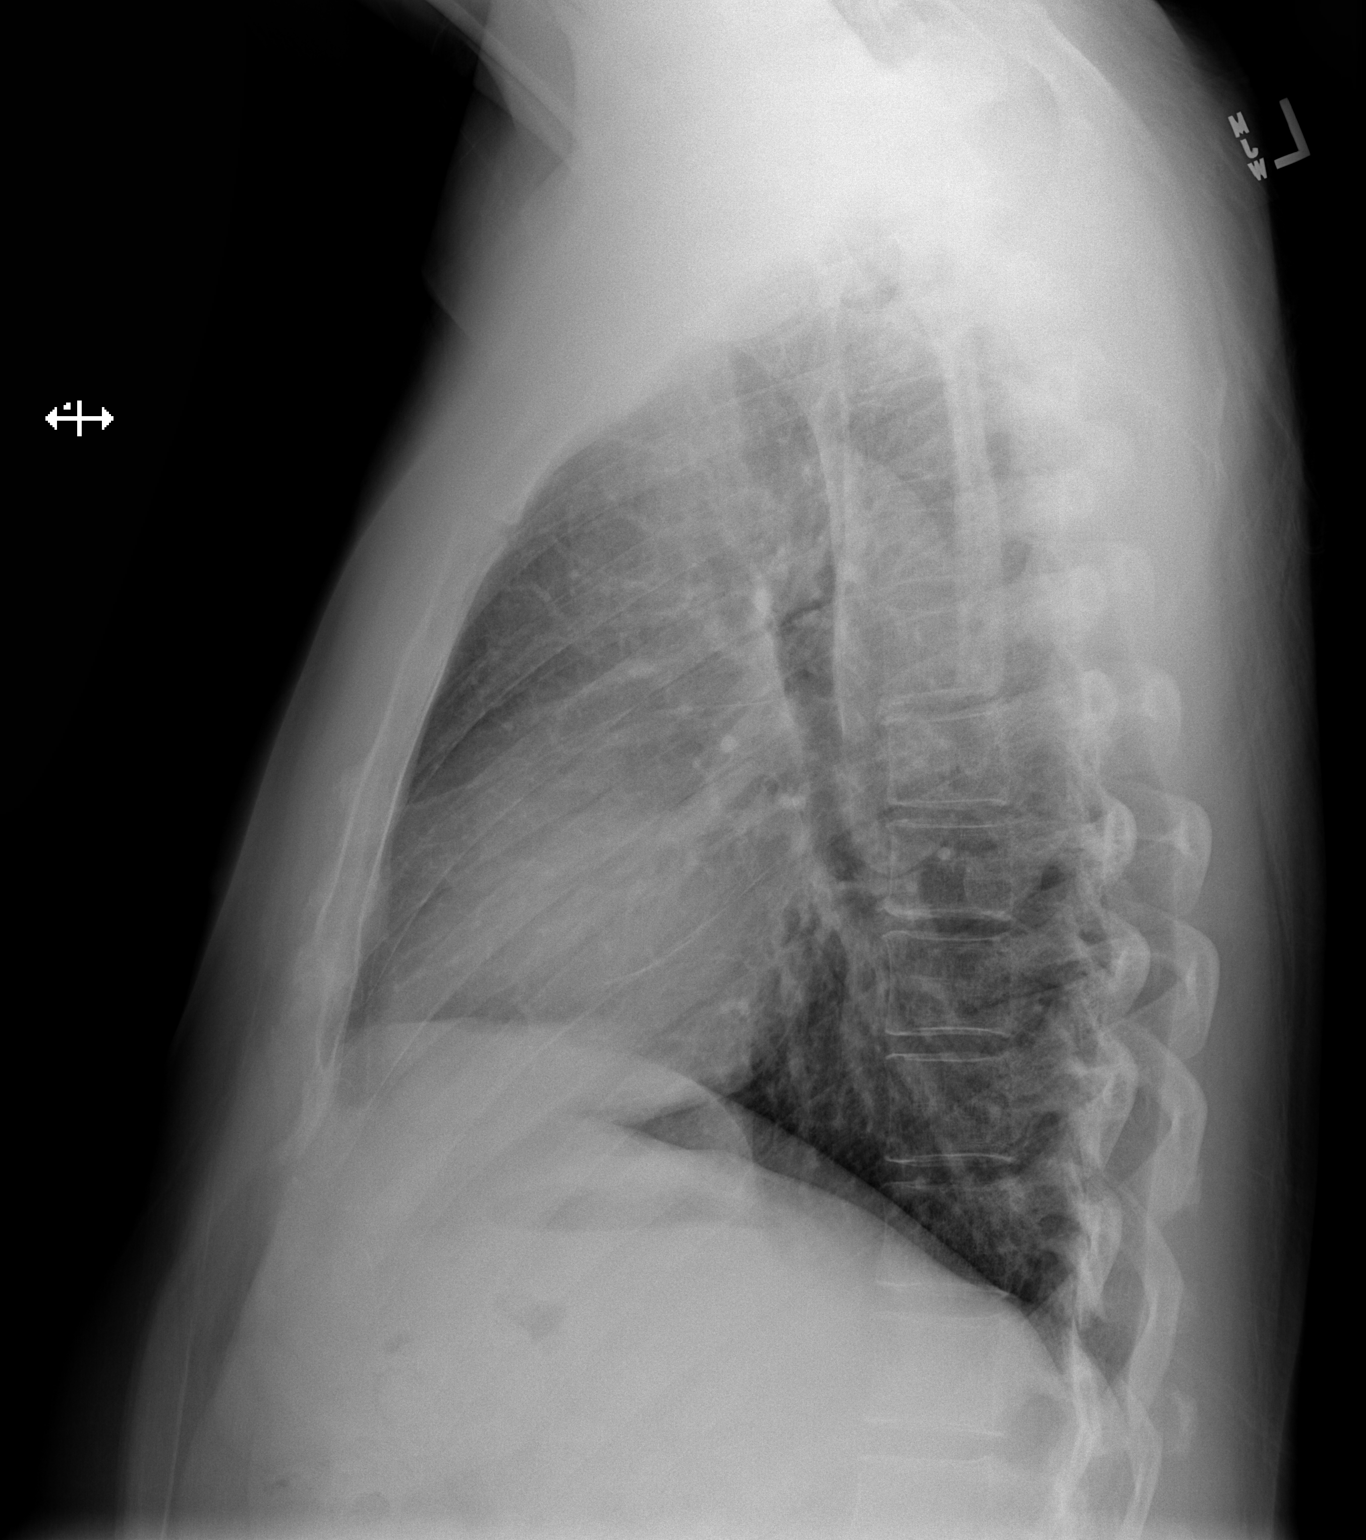

[2 of 2 positions shown; findings below may reference images not displayed]

FINDINGS: The heart size and mediastinal contours are within normal limits.
Both lungs are clear. The visualized skeletal structures are
unremarkable.
IMPRESSION: No active cardiopulmonary disease.

## 2016-12-07 IMAGING — DX DG CHEST 2V
2 series · 2 of 2 positions shown · non-contrast
Comparison: 05/06/2015

CLINICAL DATA: Atelectasis, hypertension, CABG

EXAM:
CHEST  2 VIEW

[w chest pa]
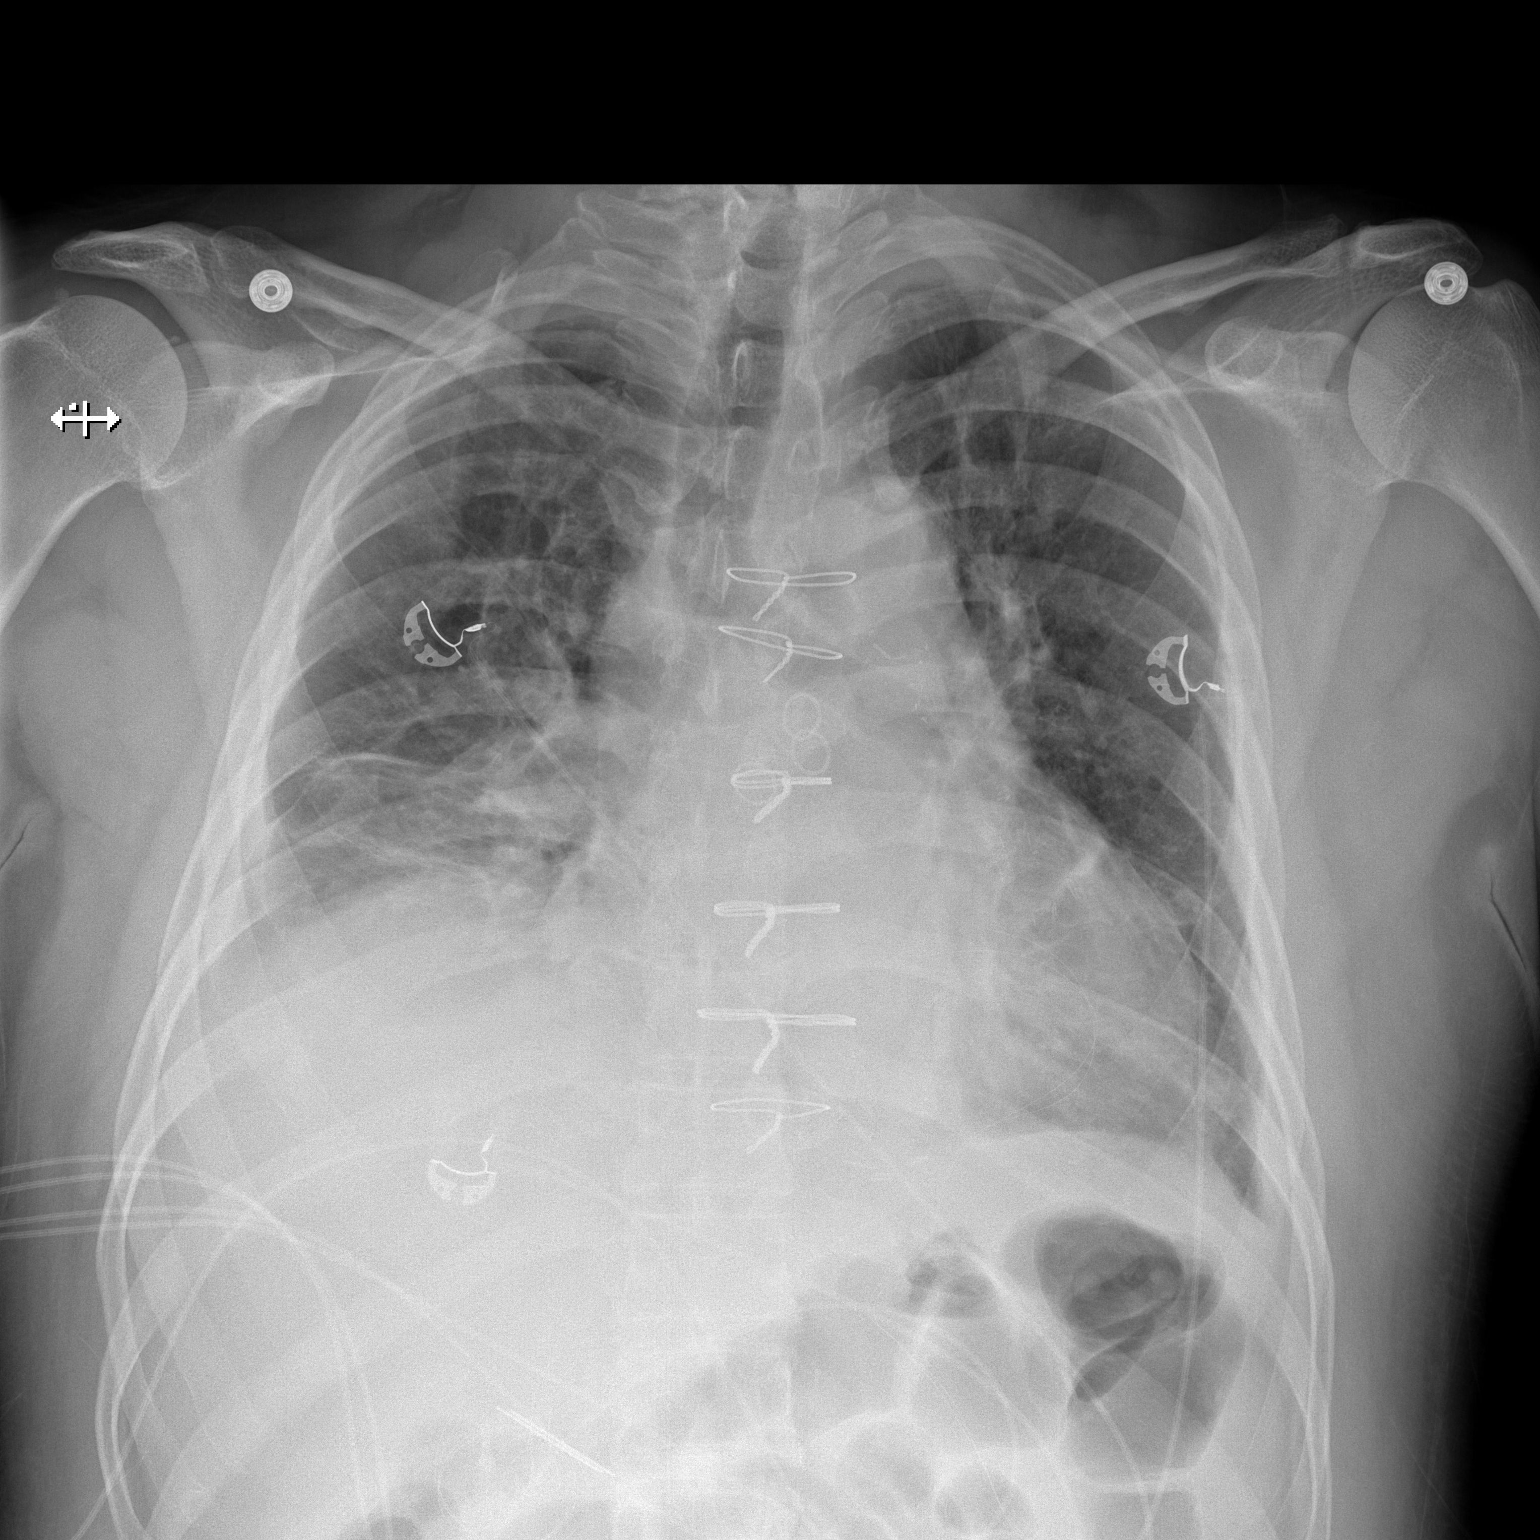

[w chest lat]
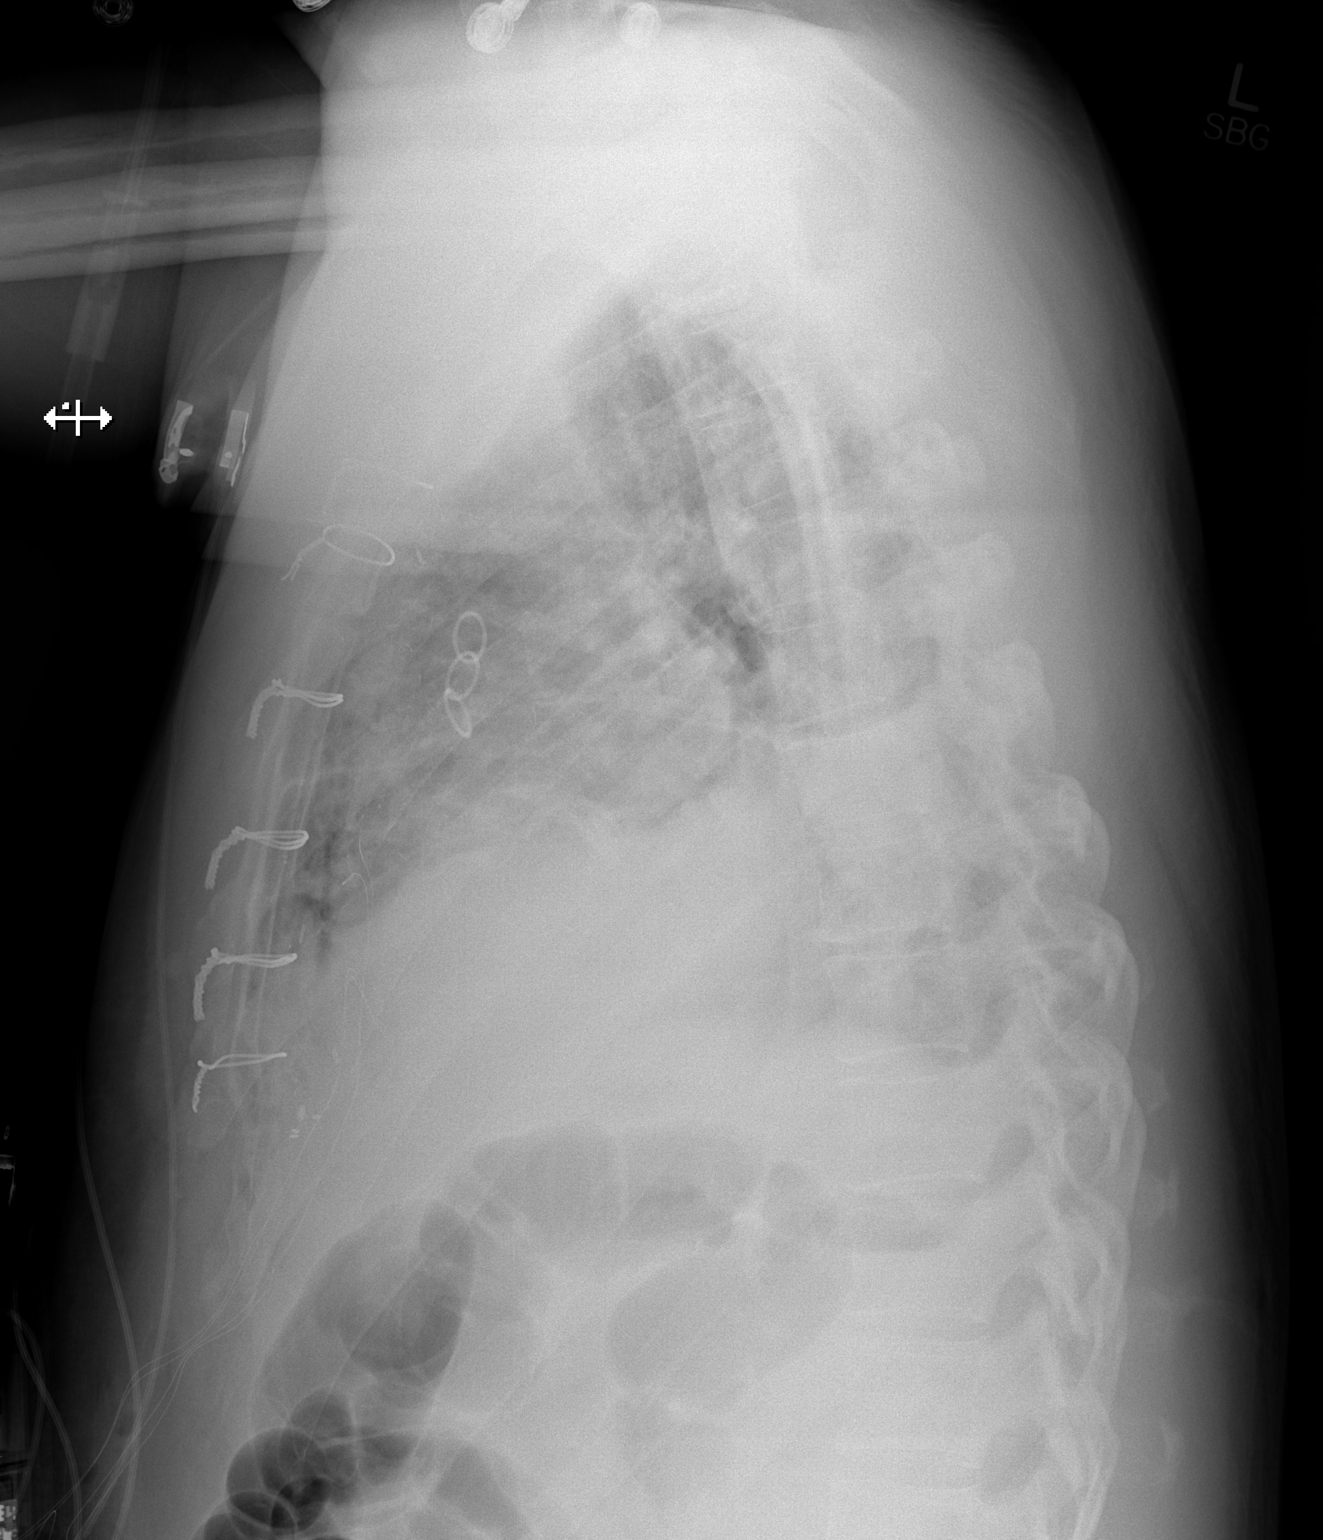

[2 of 2 positions shown; findings below may reference images not displayed]

FINDINGS: Prior CABG. There is cardiomegaly. Right basilar airspace opacity
again noted with an could reflect atelectasis or infiltrate. Left
base atelectasis is stable. Small bilateral effusions. Vascular
congestion and probable mild interstitial edema. No pneumothorax.
IMPRESSION: Cardiomegaly, suspect mild interstitial edema.

Bibasilar atelectasis or infiltrates, right greater than left,
similar to prior study.

Small effusions.
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
## 2017-09-02 DIAGNOSIS — Z9861 Coronary angioplasty status: Secondary | ICD-10-CM | POA: Diagnosis not present

## 2017-09-02 DIAGNOSIS — I2510 Atherosclerotic heart disease of native coronary artery without angina pectoris: Secondary | ICD-10-CM | POA: Diagnosis not present

## 2017-10-07 DIAGNOSIS — I2510 Atherosclerotic heart disease of native coronary artery without angina pectoris: Secondary | ICD-10-CM | POA: Diagnosis not present

## 2017-10-07 DIAGNOSIS — Z9861 Coronary angioplasty status: Secondary | ICD-10-CM | POA: Diagnosis not present

## 2018-01-13 DIAGNOSIS — E663 Overweight: Secondary | ICD-10-CM | POA: Diagnosis not present

## 2018-01-13 DIAGNOSIS — Z9861 Coronary angioplasty status: Secondary | ICD-10-CM | POA: Diagnosis not present

## 2018-01-13 DIAGNOSIS — I2510 Atherosclerotic heart disease of native coronary artery without angina pectoris: Secondary | ICD-10-CM | POA: Diagnosis not present

## 2022-09-14 ENCOUNTER — Emergency Department (HOSPITAL_COMMUNITY)
Admission: EM | Admit: 2022-09-14 | Discharge: 2022-09-14 | Disposition: A | Discharge status: Home / Self Care | Payer: 59 | Attending: Emergency Medicine | Admitting: Emergency Medicine

## 2022-09-14 ENCOUNTER — Other Ambulatory Visit: Payer: Self-pay

## 2022-09-14 ENCOUNTER — Encounter (HOSPITAL_COMMUNITY): Payer: Self-pay

## 2022-09-14 ENCOUNTER — Emergency Department (HOSPITAL_COMMUNITY): Payer: 59

## 2022-09-14 DIAGNOSIS — Y990 Civilian activity done for income or pay: Secondary | ICD-10-CM | POA: Insufficient documentation

## 2022-09-14 DIAGNOSIS — Z7982 Long term (current) use of aspirin: Secondary | ICD-10-CM | POA: Diagnosis not present

## 2022-09-14 DIAGNOSIS — S99912A Unspecified injury of left ankle, initial encounter: Secondary | ICD-10-CM | POA: Diagnosis present

## 2022-09-14 DIAGNOSIS — Z7902 Long term (current) use of antithrombotics/antiplatelets: Secondary | ICD-10-CM | POA: Diagnosis not present

## 2022-09-14 DIAGNOSIS — I2510 Atherosclerotic heart disease of native coronary artery without angina pectoris: Secondary | ICD-10-CM | POA: Diagnosis not present

## 2022-09-14 DIAGNOSIS — W502XXA Accidental twist by another person, initial encounter: Secondary | ICD-10-CM | POA: Diagnosis not present

## 2022-09-14 DIAGNOSIS — S93402A Sprain of unspecified ligament of left ankle, initial encounter: Secondary | ICD-10-CM

## 2022-09-14 DIAGNOSIS — Z79899 Other long term (current) drug therapy: Secondary | ICD-10-CM | POA: Insufficient documentation

## 2022-09-14 DIAGNOSIS — I10 Essential (primary) hypertension: Secondary | ICD-10-CM | POA: Diagnosis not present

## 2022-09-14 MED ORDER — OXYCODONE HCL 5 MG PO TABS: 5.0000 mg | ORAL_TABLET | ORAL | 0 refills | Status: AC | PRN

## 2022-09-14 MED ORDER — IBUPROFEN 600 MG PO TABS: 600.0000 mg | ORAL_TABLET | Freq: Three times a day (TID) | ORAL | 0 refills | Status: AC | PRN

## 2022-09-14 NOTE — Discharge Instructions (Addendum)
Thank you for letting us take care of you today. Your x-ray showed swelling to the outside of your ankle but no broken bones. Please continue to manage pain and swelling at home with medications such as Tylenol or ibuprofen. You may take up to 1000mg  Tylenol every 6 hours and/or 600mg  ibuprofen every 6 hours as needed for pain/swelling.  Do not take more than 4000 mg Tylenol or 3200 mg ibuprofen in 24 hours.  For continued pain or swelling in your ankle, follow-up with orthopedics for reevaluation.  You may also benefit from establishing a primary care provider.  I provided 2 clinics that you may call to establish this care.  For any new or worsening symptoms, return to the nearest ED for reevaluation.  Gracias por dejarnos cuidar de usted hoy. Su radiografa mostr hinchazn en la parte exterior del tobillo, pero no hubo huesos rotos. Contine controlando el dolor y la hinchazn en casa con medicamentos como Tylenol o ibuprofeno. Puede tomar hasta 1000 mg de Tylenol cada 6 horas y/o 600 mg de ibuprofeno cada 6 horas segn sea necesario para el dolor o la hinchazn.  No tome ms de 4000 mg de Tylenol o 3200 mg de ibuprofeno en 24 horas.  Si el dolor o la hinchazn continan en el [place], haga un seguimiento con un ortopedista para una reevaluacin.  Tambin puede beneficiarse al establecer un proveedor de Marine scientist.  Proporcion 2 clnicas a las que puede llamar para Scientist, physiological.  Ante cualquier sntoma nuevo o que empeore, regrese al servicio de urgencias ms cercano para una reevaluacin.

## 2022-09-14 NOTE — ED Triage Notes (Signed)
Pt arrived POV. Sprained L ankle a week ago, swelling has improved, but pt still has pain and swelling noted. Pt wants to make sure his L ankle isn't broken.

## 2022-09-14 NOTE — ED Provider Notes (Signed)
[place] EMERGENCY DEPARTMENT AT [facility] Provider Note   CSN: [id number] Arrival date & time: 09/14/22  1157     History  Chief Complaint  Patient presents with   Foot Injury    Left    Danny Mendoza is a 52 y.o. male with past medical history hypertension, hyperlipidemia, CAD who presents to the ED complaining of left ankle pain for the last week.  He states he was at work when he accidentally twisted the ankle out and has had pain and swelling over the lateral ankle since. States overall it is improving but he wants to make sure nothing is broken. He is able to ambulate on it. He is not taking any medications at home for pain/swelling. Possible injury to ankle previously. No pain in foot, calf, or knee.       Home Medications Prior to Admission medications   Medication Sig Start Date End Date Taking? Authorizing Provider  ibuprofen (ADVIL) 600 MG tablet Take 1 tablet (600 mg total) by mouth every 8 (eight) hours as needed for up to 3 days for moderate pain or mild pain. 09/14/22 09/17/22 Yes Danny Mendoza, Danny Mendoza  oxyCODONE (ROXICODONE) 5 MG immediate release tablet Take 1 tablet (5 mg total) by mouth every 4 (four) hours as needed for up to 2 doses for severe pain. 09/14/22  Yes Danny Mendoza, Danny Mendoza  aspirin 81 MG chewable tablet Chew 81 mg by mouth daily.    [provider]  atorvastatin (LIPITOR) 80 MG tablet Take 80 mg by mouth daily at 6 PM.    [provider]  clopidogrel (PLAVIX) 75 MG tablet Take 75 mg by mouth daily.    [provider]  lisinopril (PRINIVIL,ZESTRIL) 40 MG tablet Take 1 tablet (40 mg total) by mouth daily. 05/09/15   Ardelle Balls, Danny Mendoza  metoprolol tartrate (LOPRESSOR) 50 MG tablet Take 1 tablet (50 mg total) by mouth 2 (two) times daily. 05/09/15   Ardelle Balls, Danny Mendoza  ondansetron (ZOFRAN ODT) 4 MG disintegrating tablet Take 1 tablet (4 mg total) by mouth every 8 (eight) hours as needed. 09/30/16   Mabe,  Latanya Maudlin, MD  pantoprazole (PROTONIX) 40 MG tablet Take 1 tablet (40 mg total) by mouth at bedtime. 03/27/15   Orpah Cobb, MD      Allergies    Patient has no known allergies.    Review of Systems   Review of Systems  All other systems reviewed and are negative.   Physical Exam Updated Vital Signs BP (!) 140/98 (BP Location: Right Arm)   Pulse 75   Temp 98.1 F (36.7 C) (Oral)   Resp 16   Ht 5\' 11"  (1.803 m)   Wt 95.3 kg   SpO2 97%   BMI 29.29 kg/m  Physical Exam Vitals and nursing note reviewed.  Constitutional:      General: He is not in acute distress.    Appearance: Normal appearance. He is not ill-appearing or toxic-appearing.  HENT:     Head: Normocephalic and atraumatic.     Mouth/Throat:     Mouth: Mucous membranes are moist.  Eyes:     Conjunctiva/sclera: Conjunctivae normal.  Cardiovascular:     Rate and Rhythm: Normal rate and regular rhythm.     Heart sounds: No murmur heard. Pulmonary:     Effort: Pulmonary effort is normal.     Breath sounds: Normal breath sounds.  Abdominal:     General: Abdomen is flat.  Palpations: Abdomen is soft.  Musculoskeletal:     Cervical back: Normal range of motion and neck supple.     Comments: Slight swelling over the left lateral malleolus with mild tenderness to palpation, remainder of ankle joint is nontender, no wounds, no overlying skin changes, joint is stable, no tenderness over any aspect of the foot, 2+ DP and PT pulses, range of motion grossly intact, soft compartments, no calf tenderness, proximal tib-fib tenderness, or tenderness over the knee  Skin:    General: Skin is warm and dry.     Capillary Refill: Capillary refill takes less than 2 seconds.  Neurological:     Mental Status: He is alert. Mental status is at baseline.  Psychiatric:        Behavior: Behavior normal.     ED Results / Procedures / Treatments   Labs (all labs ordered are listed, but only abnormal results are displayed) Labs  Reviewed - No data to display  EKG None  Radiology DG Ankle Complete Left  Result Date: 09/14/2022 CLINICAL DATA:  Patient fell and rolled left ankle last week at work. Persistent pain and swelling. EXAM: LEFT ANKLE COMPLETE - 3+ VIEW COMPARISON:  None Available. FINDINGS: The ankle mortise is symmetric and intact. There is a prominent medial aspect of the navicular, and this appears to be the patient's chronic anatomy. Mild-to-moderate chronic enthesopathic change at the Achilles insertion on the calcaneus. Minimal lateral malleolar soft tissue swelling. No acute fracture is seen. No dislocation. IMPRESSION: Minimal lateral malleolar soft tissue swelling. No acute fracture is seen. Electronically Signed   By: Neita Garnet M.D.   On: 09/14/2022 13:49    Procedures Procedures    Medications Ordered in ED Medications - No data to display  ED Course/ Medical Decision Making/ A&P                             Medical Decision Making Amount and/or Complexity of Data Reviewed Radiology: ordered. Decision-making details documented in ED Course.  Risk Prescription drug management.   Medical Decision Making:   Danny Mendoza is a 52 y.o. male who presented to the ED today with ankle pain detailed above.    Patient's presentation is complicated by their history of injury.  Complete initial physical exam performed, notably the patient was in no acute distress. Slight swelling over the left lateral malleolus with mild tenderness to palpation, remainder of ankle joint is nontender, no wounds, no overlying skin changes, joint is stable, no tenderness over any aspect of the foot, 2+ DP and PT pulses, range of motion grossly intact, soft compartments, no calf tenderness, proximal tib-fib tenderness, or tenderness over the knee.    Reviewed and confirmed nursing documentation for past medical history, family history, social history.    Initial Assessment:   With the patient's presentation,  differential diagnosis includes but is not limited to sprain, strain, fracture, dislocation, compartment syndrome, septic joint.  This is most consistent with an acute complicated illness  Initial Plan:  X-ray to evaluate for bony pathology Objective evaluation as below reviewed   Initial Study Results:   Radiology:  All images reviewed independently. Agree with radiology report at this time.   DG Ankle Complete Left  Result Date: 09/14/2022 CLINICAL DATA:  Patient fell and rolled left ankle last week at work. Persistent pain and swelling. EXAM: LEFT ANKLE COMPLETE - 3+ VIEW COMPARISON:  None Available. FINDINGS: The ankle mortise is symmetric  and intact. There is a prominent medial aspect of the navicular, and this appears to be the patient's chronic anatomy. Mild-to-moderate chronic enthesopathic change at the Achilles insertion on the calcaneus. Minimal lateral malleolar soft tissue swelling. No acute fracture is seen. No dislocation. IMPRESSION: Minimal lateral malleolar soft tissue swelling. No acute fracture is seen. Electronically Signed   By: Neita Garnet M.D.   On: 09/14/2022 13:49      Final Assessment and Plan:   52 year old male presents to the ED complaining of left ankle pain after twisting it.  No overlying skin changes.  Do not suspect septic joint.  Mechanical injury.  He is able to bear weight.  Neurovascularly intact.  Do not suspect compartment syndrome.  Range of motion grossly intact.  Low suspicion for fracture or dislocation.  No overlying wounds.  X-ray with soft tissue swelling but no bony pathology.  Reassuring exam.  Suspect mild ankle sprain with ability to bear weight.  Patient is driving today.  Unable to give narcotic pain medication at home and he does not want a shot.  Will give 2 doses of narcotic pain medication for pain control at home.  PDMP reviewed and negative.  Otherwise will have patient take prescription strength ibuprofen and/or Tylenol as needed for pain.   Discussed recommendation for following up with orthopedics for any continued symptoms.  Patient expressed understanding of plan.  Strict ED return precautions given, all questions answered, and stable for discharge.   Clinical Impression:  1. Sprain of left ankle, unspecified ligament, initial encounter      Discharge           Final Clinical Impression(s) / ED Diagnoses Final diagnoses:  Sprain of left ankle, unspecified ligament, initial encounter    Rx / DC Orders ED Discharge Orders          Ordered    ibuprofen (ADVIL) 600 MG tablet  Every 8 hours PRN        09/14/22 1404    oxyCODONE (ROXICODONE) 5 MG immediate release tablet  Every 4 hours PRN        09/14/22 1404              Tonette Lederer, Danny Mendoza 09/14/22 1422    Alvira Monday, MD 09/14/22 2337
